# Patient Record
Sex: Male | Born: 1940 | Race: Black or African American | Hispanic: No | Marital: Married | Smoking: Former smoker
Health system: Southern US, Community
[De-identification: ages and names within clinical notes are randomized; demographics above are authoritative.]

## PROBLEM LIST (undated history)

## (undated) DIAGNOSIS — E785 Hyperlipidemia, unspecified: Secondary | ICD-10-CM

## (undated) DIAGNOSIS — M1990 Unspecified osteoarthritis, unspecified site: Secondary | ICD-10-CM

## (undated) DIAGNOSIS — E119 Type 2 diabetes mellitus without complications: Secondary | ICD-10-CM

## (undated) DIAGNOSIS — C801 Malignant (primary) neoplasm, unspecified: Secondary | ICD-10-CM

## (undated) DIAGNOSIS — E559 Vitamin D deficiency, unspecified: Secondary | ICD-10-CM

## (undated) HISTORY — PX: OTHER SURGICAL HISTORY: SHX169

---

## 2008-10-27 ENCOUNTER — Emergency Department (HOSPITAL_COMMUNITY): Admission: EM | Admit: 2008-10-27 | Discharge: 2008-10-27 | Payer: Self-pay | Admitting: Emergency Medicine

## 2009-12-04 ENCOUNTER — Emergency Department (HOSPITAL_COMMUNITY): Admission: EM | Admit: 2009-12-04 | Discharge: 2009-12-04 | Payer: Self-pay | Admitting: Emergency Medicine

## 2010-01-08 ENCOUNTER — Ambulatory Visit (HOSPITAL_COMMUNITY): Admission: RE | Admit: 2010-01-08 | Discharge: 2010-01-08 | Payer: Self-pay | Admitting: Orthopaedic Surgery

## 2010-01-15 ENCOUNTER — Ambulatory Visit (HOSPITAL_COMMUNITY): Admission: RE | Admit: 2010-01-15 | Discharge: 2010-01-15 | Payer: Self-pay | Admitting: Orthopaedic Surgery

## 2010-01-17 ENCOUNTER — Encounter: Payer: Self-pay | Admitting: Orthopaedic Surgery

## 2010-01-30 ENCOUNTER — Encounter: Payer: Self-pay | Admitting: Orthopaedic Surgery

## 2010-03-01 ENCOUNTER — Encounter: Payer: Self-pay | Admitting: Orthopaedic Surgery

## 2010-11-18 LAB — GLUCOSE, CAPILLARY: Glucose-Capillary: 109 mg/dL — ABNORMAL HIGH (ref 70–99)

## 2010-11-19 LAB — DIFFERENTIAL
Basophils Absolute: 0.1 10*3/uL (ref 0.0–0.1)
Eosinophils Absolute: 0.2 10*3/uL (ref 0.0–0.7)
Eosinophils Relative: 3 % (ref 0–5)
Lymphocytes Relative: 26 % (ref 12–46)
Lymphs Abs: 1.6 10*3/uL (ref 0.7–4.0)
Monocytes Relative: 6 % (ref 3–12)
Neutrophils Relative %: 64 % (ref 43–77)

## 2010-11-19 LAB — URINALYSIS, ROUTINE W REFLEX MICROSCOPIC
Nitrite: POSITIVE — AB
Protein, ur: NEGATIVE mg/dL
pH: 6 (ref 5.0–8.0)

## 2010-11-19 LAB — CBC
MCHC: 34.5 g/dL (ref 30.0–36.0)
Platelets: 223 10*3/uL (ref 150–400)
RBC: 3.97 MIL/uL — ABNORMAL LOW (ref 4.22–5.81)
RDW: 13.5 % (ref 11.5–15.5)
WBC: 6.3 10*3/uL (ref 4.0–10.5)

## 2010-11-19 LAB — URINE MICROSCOPIC-ADD ON

## 2017-12-07 ENCOUNTER — Ambulatory Visit (INDEPENDENT_AMBULATORY_CARE_PROVIDER_SITE_OTHER): Payer: Medicare Other | Admitting: Orthopedic Surgery

## 2017-12-07 ENCOUNTER — Ambulatory Visit (INDEPENDENT_AMBULATORY_CARE_PROVIDER_SITE_OTHER): Payer: Medicare Other

## 2017-12-07 VITALS — BP 109/53 | HR 71 | Ht 70.0 in | Wt 245.0 lb

## 2017-12-07 DIAGNOSIS — M1711 Unilateral primary osteoarthritis, right knee: Secondary | ICD-10-CM | POA: Diagnosis not present

## 2017-12-07 DIAGNOSIS — M25561 Pain in right knee: Secondary | ICD-10-CM

## 2017-12-07 NOTE — Progress Notes (Signed)
  NEW PATIENT OFFICE VISIT   Chief Complaint  Patient presents with  . Knee Pain    Right knee pain, no injury.    77 year old male presents for evaluation of his right knee  He complains of dull right knee pain medial aspect several months if not years associated with decreased range of motion   Review of Systems  Respiratory: Negative.   Cardiovascular: Negative for chest pain.  All other systems reviewed and are negative.    medical history diabetes hypertension history of cancer  No family history of serious medical disease  Had surgery on the left knee and a right index finger partial amputation   Social History   Tobacco Use  . Smoking status: Not on file  Substance Use Topics  . Alcohol use: Not on file  . Drug use: Not on file    No outpatient medications have been marked as taking for the 12/07/17 encounter (Office Visit) with Carole Civil, MD.    BP (!) 109/53   Pulse 71   Ht 5\' 10"  (1.778 m)   Wt 245 lb (111.1 kg)   BMI 35.15 kg/m   Physical Exam  Constitutional: He is oriented to person, place, and time. He appears well-developed and well-nourished.  Vital signs have been reviewed and are stable. Gen. appearance the patient is well-developed and well-nourished with normal grooming and hygiene.   Neurological: He is alert and oriented to person, place, and time.  Skin: Skin is warm and dry. No erythema.  Psychiatric: He has a normal mood and affect.  Vitals reviewed.   Ortho Exam Cane assisted ambulation  Right knee flexion arc 110 degrees slight flexion  contracture.  Knee stable anterior posterior medial lateral.  Muscle tone and strength normal no atrophy.  Skin normal no rash or erythema.  Distal peripheral edema mild sensation normal tenderness medial joint line without effusion  Left knee also symptomatic with medial joint line tenderness but no effusion range of motion arc similar MEDICAL DECISION SECTION  xrays ordered?  Yes  My  independent reading of xrays: See the report severe arthritis mild varus deformity right knee   Encounter Diagnoses  Name Primary?  . Right knee pain, unspecified chronicity Yes  . Primary osteoarthritis of right knee    The patient needs a total knee he says he does not want to do it right now so we gave him an injection advised him to come back when he symptomatic Procedure note right knee injection verbal consent was obtained to inject right knee joint  Timeout was completed to confirm the site of injection  The medications used were 40 mg of Depo-Medrol and 1% lidocaine 3 cc  Anesthesia was provided by ethyl chloride and the skin was prepped with alcohol.  After cleaning the skin with alcohol a 20-gauge needle was used to inject the right knee joint. There were no complications. A sterile bandage was applied.  PLAN:   I did give him some information about total knees for him to review he will be back because this will not take care of his problem he is aware.  He is agrees with the plan

## 2017-12-07 NOTE — Patient Instructions (Signed)
Total Knee Replacement Total knee replacement is a surgery to replace your knee joint with a man-made (prosthetic) joint. The man-made joint is called a prosthesis. It replaces parts of the thigh bone (femur), lower leg bone (tibia), and kneecap (patella). This surgery is done to lessen pain and improve knee movement. What happens before the procedure?  Ask your doctor about: ? Changing or stopping your normal medicines. This is especially important if you take diabetes medicines or blood thinners. ? Taking medicines such as aspirin and ibuprofen. These medicines can thin your blood. Do not take these medicines before your procedure if your doctor tells you not to.  Get all dental care that you need done before your procedure. Plan to not have dental work done for 3 months after your surgery.  Follow instructions from your doctor about what you cannot eat or drink.  Ask your doctor how your surgical site will be marked or identified.  You may be given antibiotic medicine to help prevent infection.  If your doctor prescribes physical therapy, do exercises as told.  Do not use any tobacco products, such as cigarettes, chewing tobacco, or e-cigarettes. If you need help quitting, ask your doctor.  You may have a physical exam.  You may have tests, such as: ? X-rays. ? MRI. ? CT scan. ? Bone scans.  You may have a blood or urine sample taken.  Plan to have someone take you home after the procedure.  If you will be going home right after the procedure, plan to have someone with you for at least 24 hours. It is best to have someone help care for you for at least 4-6 weeks after surgery. What happens during the procedure?  To reduce your risk of infection: ? Your health care team will wash or sanitize their hands. ? Your skin will be washed with soap.  An IV tube will be put into one of your veins.  You will be given one or more of the following: ? Sedative. This is a medicine that  makes you relaxed. ? Local anesthetic. This is a medicine to numb the area. ? General anesthetic. This is a medicine that makes you fall asleep. ? Spinal anesthetic. This is a medicine that numbs your body below the waist. ? Regional anesthetic. This is a medicine that numbs everything below the injection site.  A cut (incision) will be made in your knee.  Damaged parts of your thigh bone, lower leg bone, and kneecap will be removed.  A piece of metal (liner) will be placed on your thigh bone. Pieces of plastic will be placed on your lower leg bone and the underside of your kneecap.  One or more small tubes (drains) may be placed near your cut to help drain fluid.  Your cut will be closed with stitches (sutures), skin glue, or skin tape (adhesive) strips. Medicine may be put on your cut.  A bandage (dressing) will be placed over your cut. The procedure may vary among doctors and hospitals. What happens after the procedure?  Your blood pressure, heart rate, breathing rate, and blood oxygen level will be monitored often until the medicines you were given have worn off.  You may continue to get fluids and medicines through an IV tube.  You will have some pain. There will be medicines to help you.  You may have fluid coming from a drain.  You may have to wear special socks (compression stockings). These help to prevent blood clots and   reduce swelling in your legs.  You will be told to move around as much as possible.  You may be given a continuous passive motion machine to use at home. You will be shown how to use this machine.  Do not drive for 24 hours if you received a sedative. This information is not intended to replace advice given to you by your health care provider. Make sure you discuss any questions you have with your health care provider. Document Released: 11/10/2011 Document Revised: 04/21/2016 Document Reviewed: 07/25/2015 Elsevier Interactive Patient Education  2017  Elsevier Inc.  

## 2020-02-20 ENCOUNTER — Emergency Department (HOSPITAL_COMMUNITY)
Admission: EM | Admit: 2020-02-20 | Discharge: 2020-02-20 | Disposition: A | Discharge status: Home / Self Care | Payer: Medicare Other | Attending: Emergency Medicine | Admitting: Emergency Medicine

## 2020-02-20 ENCOUNTER — Emergency Department (HOSPITAL_COMMUNITY): Payer: Medicare Other

## 2020-02-20 ENCOUNTER — Other Ambulatory Visit: Payer: Self-pay

## 2020-02-20 ENCOUNTER — Encounter (HOSPITAL_COMMUNITY): Payer: Self-pay

## 2020-02-20 DIAGNOSIS — M25551 Pain in right hip: Secondary | ICD-10-CM | POA: Insufficient documentation

## 2020-02-20 DIAGNOSIS — M25552 Pain in left hip: Secondary | ICD-10-CM | POA: Diagnosis not present

## 2020-02-20 DIAGNOSIS — E119 Type 2 diabetes mellitus without complications: Secondary | ICD-10-CM | POA: Insufficient documentation

## 2020-02-20 DIAGNOSIS — Z87891 Personal history of nicotine dependence: Secondary | ICD-10-CM | POA: Insufficient documentation

## 2020-02-20 DIAGNOSIS — R2241 Localized swelling, mass and lump, right lower limb: Secondary | ICD-10-CM | POA: Diagnosis present

## 2020-02-20 HISTORY — DX: Type 2 diabetes mellitus without complications: E11.9

## 2020-02-20 HISTORY — DX: Unspecified osteoarthritis, unspecified site: M19.90

## 2020-02-20 HISTORY — DX: Vitamin D deficiency, unspecified: E55.9

## 2020-02-20 HISTORY — DX: Malignant (primary) neoplasm, unspecified: C80.1

## 2020-02-20 HISTORY — DX: Hyperlipidemia, unspecified: E78.5

## 2020-02-20 MED ORDER — DICLOFENAC SODIUM 1 % EX GEL: 2.0000 g | Freq: Four times a day (QID) | CUTANEOUS | 0 refills | Status: AC | PRN

## 2020-02-20 NOTE — ED Triage Notes (Signed)
Pt reports bilateral hip pain for a while which has gotten worse over the last 2 weeks. Pt has been taking tylenol. Pt feels like right leg swelling

## 2020-02-20 NOTE — ED Provider Notes (Signed)
Emergency Department Provider Note   I have reviewed the triage vital signs and the nursing notes.   HISTORY  Chief Complaint Hip Pain   HPI Gerald Crosby is a 79 y.o. male with past history of diabetes and hyperlipidemia presents to the emergency department with bilateral hip pain worsening over the past 2 weeks.  No known injury.  Patient denies significant lower back pain.  He has occasional radiation of discomfort down the left leg but no weakness or numbness.  He is able to ambulate with a cane.  He has seen Dr. Aline Brochure in the past with knee discomfort and they have discussed ultimately needing knee replacements.  He is not having fevers or joint swelling.  He does have a primary care doctor appointment set up this week to see a new PCP.  He denies any chest pain or shortness of breath symptoms.  He does mention some possible swelling in the right leg compared to the left which is also new over the past 2 weeks.   Past Medical History:  Diagnosis Date  . Arthritis   . Cancer [facility])    prostate cancer  . Diabetes mellitus without complication ([place])   . Hyperlipemia   . Vitamin D deficiency     There are no problems to display for this patient.   Past Surgical History:  Procedure Laterality Date  . right knee sx      Allergies Patient has no known allergies.  No family history on file.  Social History Social History   Tobacco Use  . Smoking status: Former Smoker  Substance Use Topics  . Alcohol use: Never  . Drug use: Never    Review of Systems  Constitutional: No fever/chills Eyes: No visual changes. ENT: No sore throat. Cardiovascular: Denies chest pain. Respiratory: Denies shortness of breath. Gastrointestinal: No abdominal pain.  No nausea, no vomiting.  No diarrhea.  No constipation. Genitourinary: Negative for dysuria. Musculoskeletal: Negative for back pain. Positive bilateral hip pain.  Skin: Negative for rash. Neurological: Negative for  headaches, focal weakness or numbness. No groin numbness or stool/urine incontinence or retention.   10-point ROS otherwise negative.  ____________________________________________   PHYSICAL EXAM:  VITAL SIGNS: ED Triage Vitals  Enc Vitals Group     BP 02/20/20 0808 129/73     Pulse Rate 02/20/20 0808 72     Resp --      Temp 02/20/20 0808 98 F (36.7 C)     Temp src --      SpO2 02/20/20 0808 97 %     Weight 02/20/20 0818 250 lb (113.4 kg)     Height 02/20/20 0818 5\' 9"  (1.753 m)   Constitutional: Alert and oriented. Well appearing and in no acute distress. Eyes: Conjunctivae are normal.  Head: Atraumatic. Nose: No congestion/rhinnorhea. Mouth/Throat: Mucous membranes are moist.  Neck: No stridor.  Cardiovascular: Normal rate, regular rhythm. Good peripheral circulation. Grossly normal heart sounds.   Respiratory: Normal respiratory effort.  No retractions. Lungs CTAB. Gastrointestinal: Soft and nontender. No distention.  Musculoskeletal: No gross deformities of extremities.  No erythema or swelling of the knees.  Normal passive range of motion of bilateral hips.  No tenderness over the ankles. Neurologic:  Normal speech and language.  Skin:  Skin is warm, dry and intact. No rash noted.  ____________________________________________  RADIOLOGY  Plain films reviewed.  ____________________________________________   PROCEDURES  Procedure(s) performed:   Procedures  None ____________________________________________   INITIAL IMPRESSION / ASSESSMENT AND PLAN /  ED COURSE  Pertinent labs & imaging results that were available during my care of the patient were reviewed by me and considered in my medical decision making (see chart for details).   Patient presents the emergency department with bilateral hip pain.  He continues to be ambulatory with a cane.  No concern for septic joint.  Plain films reviewed. OA noted without fracture or dislocation. Plan for PCP and  Ortho f/u. Topical pain med Rx sent to pharmacy.  ____________________________________________  FINAL CLINICAL IMPRESSION(S) / ED DIAGNOSES  Final diagnoses:  Bilateral hip pain    NEW OUTPATIENT MEDICATIONS STARTED DURING THIS VISIT:  Discharge Medication List as of 02/20/2020 11:07 AM    START taking these medications   Details  diclofenac Sodium (VOLTAREN) 1 % GEL Apply 2 g topically 4 (four) times daily as needed (pain)., Starting Mon 02/20/2020, Normal        Note:  This document was prepared using Dragon voice recognition software and may include unintentional dictation errors.  Nanda Quinton, MD, [facility] Emergency Medicine    Sebrena Engh, [facility], MD 02/21/20 1146

## 2020-02-20 NOTE — Discharge Instructions (Signed)
You were seen in the emergency department today with pain in both hips.  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  There are no broken bones or dislocations.  I have listed the name of your orthopedic specialist on the paperwork here.  I like for you to call their office today to schedule a follow-up appointment.  I have called in a prescription for some pain ointment to rub in the area of pain.  Please keep your follow-up appointment with your primary care doctor this week and return to the emergency department any new or suddenly worsening symptoms.

## 2020-02-28 ENCOUNTER — Other Ambulatory Visit: Payer: Self-pay

## 2020-02-28 ENCOUNTER — Ambulatory Visit (INDEPENDENT_AMBULATORY_CARE_PROVIDER_SITE_OTHER): Payer: Medicare Other | Admitting: Orthopaedic Surgery

## 2020-02-28 ENCOUNTER — Encounter: Payer: Self-pay | Admitting: Orthopaedic Surgery

## 2020-02-28 VITALS — BP 157/64 | HR 73 | Ht 69.0 in | Wt 251.0 lb

## 2020-02-28 DIAGNOSIS — M25551 Pain in right hip: Secondary | ICD-10-CM

## 2020-02-28 DIAGNOSIS — M25561 Pain in right knee: Secondary | ICD-10-CM | POA: Diagnosis not present

## 2020-02-28 DIAGNOSIS — M25552 Pain in left hip: Secondary | ICD-10-CM

## 2020-02-28 DIAGNOSIS — G8929 Other chronic pain: Secondary | ICD-10-CM

## 2020-02-28 MED ORDER — DICLOFENAC SODIUM 75 MG PO TBEC: 75.0000 mg | DELAYED_RELEASE_TABLET | Freq: Two times a day (BID) | ORAL | 2 refills | Status: DC

## 2020-02-28 NOTE — Progress Notes (Signed)
Subjective:    Patient ID: Gerald Crosby, male    DOB: November 19, 1940, 79 y.o.   MRN: [id number]  HPI He has long history of right knee pain. He saw Dr. Aline Brochure for this in 2018 and had injection.  He has good and bad days but has had more swelling recently.  He was told about a total knee in the past.  He went to the ER 02-20-2020 complaining of hip pain and some knee pain.  X-rays were done of the hips.    I have independently reviewed and interpreted x-rays of this patient done at another site by another physician or qualified health professional.  He says his right knee is hurting more today.  He wants fluid removed.  He is not taking any NSAIDs. He has no GI problem.  He has used Voltaren Gel which helps.  Tylenol does not help.   Review of Systems  Constitutional: Positive for activity change.  Musculoskeletal: Positive for arthralgias, gait problem and joint swelling.  All other systems reviewed and are negative.  For Review of Systems, all other systems reviewed and are negative.  The following is a summary of the past history medically, past history surgically, known current medicines, social history and family history.  This information is gathered electronically by the computer from prior information and documentation.  I review this each visit and have found including this information at this point in the chart is beneficial and informative.   Past Medical History:  Diagnosis Date  . Arthritis   . Cancer [facility])    prostate cancer  . Diabetes mellitus without complication ([place])   . Hyperlipemia   . Vitamin D deficiency     Past Surgical History:  Procedure Laterality Date  . right knee sx      Current Outpatient Medications on File Prior to Visit  Medication Sig Dispense Refill  . Cholecalciferol (VITAMIN D3) 1.25 MG (50000 UT) CAPS Take 1 capsule by mouth daily.    . diclofenac Sodium (VOLTAREN) 1 % GEL Apply 2 g topically 4 (four) times daily as needed (pain).  50 g 0  . doxazosin (CARDURA) 8 MG tablet Take 8 mg by mouth daily.    Marland Kitchen glipiZIDE (GLUCOTROL XL) 5 MG 24 hr tablet Take 5 mg by mouth daily with breakfast.    . lovastatin (MEVACOR) 40 MG tablet Take 80 mg by mouth at bedtime.     No current facility-administered medications on file prior to visit.    Social History   Socioeconomic History  . Marital status: Married    Spouse name: Not on file  . Number of children: Not on file  . Years of education: Not on file  . Highest education level: Not on file  Occupational History  . Not on file  Tobacco Use  . Smoking status: Former Research scientist (life sciences)  . Smokeless tobacco: Never Used  Vaping Use  . Vaping Use: Never used  Substance and Sexual Activity  . Alcohol use: Never  . Drug use: Never  . Sexual activity: Not on file  Other Topics Concern  . Not on file  Social History Narrative  . Not on file   Social Determinants of Health   Financial Resource Strain:   . Difficulty of Paying Living Expenses:   Food Insecurity:   . Worried About Charity fundraiser in the Last Year:   . [place] in the Last Year:   Transportation Needs:   . Lack of  Transportation (Medical):   Marland Kitchen Lack of Transportation (Non-Medical):   Physical Activity:   . Days of Exercise per Week:   . Minutes of Exercise per Session:   Stress:   . Feeling of Stress :   Social Connections:   . Frequency of Communication with Friends and Family:   . Frequency of Social Gatherings with Friends and Family:   . Attends Religious Services:   . Active Member of Clubs or Organizations:   . Attends Archivist Meetings:   Marland Kitchen Marital Status:   Intimate Partner Violence:   . Fear of Current or Ex-Partner:   . Emotionally Abused:   Marland Kitchen Physically Abused:   . Sexually Abused:     History reviewed. No pertinent family history.  BP (!) 157/64   Pulse 73   Ht 5\' 9"  (1.753 m)   Wt 251 lb (113.9 kg)   BMI 37.07 kg/m   Body mass index is 37.07 kg/m.       Objective:   Physical Exam Vitals and nursing note reviewed.  Constitutional:      Appearance: He is well-developed.  HENT:     Head: Normocephalic and atraumatic.  Eyes:     Conjunctiva/sclera: Conjunctivae normal.     Pupils: Pupils are equal, round, and reactive to light.  Cardiovascular:     Rate and Rhythm: Normal rate and regular rhythm.  Pulmonary:     Effort: Pulmonary effort is normal.  Abdominal:     Palpations: Abdomen is soft.  Musculoskeletal:     Cervical back: Normal range of motion and neck supple.       Legs:  Skin:    General: Skin is warm and dry.  Neurological:     Mental Status: He is alert and oriented to person, place, and time.     Cranial Nerves: No cranial nerve deficit.     Motor: No abnormal muscle tone.     Coordination: Coordination normal.     Deep Tendon Reflexes: Reflexes are normal and symmetric. Reflexes normal.  Psychiatric:        Behavior: Behavior normal.        Thought Content: Thought content normal.        Judgment: Judgment normal.           Assessment & Plan:   Encounter Diagnoses  Name Primary?  . Chronic pain of right knee Yes  . Bilateral hip pain    PROCEDURE NOTE:  The patient request injection, verbal consent was obtained.  The right knee was prepped appropriately after time out was performed.   Sterile technique was observed and anesthesia was provided by ethyl chloride and a 20-gauge needle was used to inject the knee area.  A 16-gauge needle was then used to aspirate the knee.  Color of fluid aspirated was bloody  Total cc's aspirated was 25.    Injection of 1 cc of Depo-Medrol 40 mg with several cc's of plain xylocaine was then performed.  A band aid dressing was applied.  The patient was advised to apply ice later today and tomorrow to the injection sight as needed.  I have given Rx for Voltaren 75 po.  Return in three weeks.  Call if any problem.  Precautions discussed.   Electronically  Signed Sanjuana Kava, MD 6/29/20212:19 PM

## 2020-03-20 ENCOUNTER — Encounter: Payer: Self-pay | Admitting: Orthopaedic Surgery

## 2020-03-20 ENCOUNTER — Other Ambulatory Visit: Payer: Self-pay

## 2020-03-20 ENCOUNTER — Ambulatory Visit (INDEPENDENT_AMBULATORY_CARE_PROVIDER_SITE_OTHER): Payer: Medicare Other | Admitting: Orthopaedic Surgery

## 2020-03-20 VITALS — BP 146/60 | HR 87 | Ht 69.0 in | Wt 250.0 lb

## 2020-03-20 DIAGNOSIS — M25561 Pain in right knee: Secondary | ICD-10-CM | POA: Diagnosis not present

## 2020-03-20 DIAGNOSIS — G8929 Other chronic pain: Secondary | ICD-10-CM | POA: Diagnosis not present

## 2020-03-20 NOTE — Progress Notes (Signed)
PROCEDURE NOTE:  The patient requests injections of the right knee , verbal consent was obtained.  The right knee was prepped appropriately after time out was performed.   Sterile technique was observed and injection of 1 cc of Depo-Medrol 40 mg with several cc's of plain xylocaine. Anesthesia was provided by ethyl chloride and a 20-gauge needle was used to inject the knee area. The injection was tolerated well.  A band aid dressing was applied.  The patient was advised to apply ice later today and tomorrow to the injection sight as needed.  Return in one month.  Electronically Signed Sanjuana Kava, MD 7/20/20212:48 PM

## 2020-04-17 ENCOUNTER — Other Ambulatory Visit: Payer: Self-pay

## 2020-04-17 ENCOUNTER — Encounter: Payer: Self-pay | Admitting: Orthopaedic Surgery

## 2020-04-17 ENCOUNTER — Ambulatory Visit (INDEPENDENT_AMBULATORY_CARE_PROVIDER_SITE_OTHER): Payer: Medicare Other | Admitting: Orthopaedic Surgery

## 2020-04-17 VITALS — Ht 69.0 in | Wt 258.0 lb

## 2020-04-17 DIAGNOSIS — M7061 Trochanteric bursitis, right hip: Secondary | ICD-10-CM | POA: Diagnosis not present

## 2020-04-17 NOTE — Progress Notes (Signed)
Patient Gerald Crosby, male DOB:17-Aug-1941, 79 y.o. MCN:[id number]  Chief Complaint  Patient presents with  . Knee Pain    right knee   . Hip Pain    Right side comes and goes,     HPI  Gerald Crosby is a 79 y.o. male who has developed pain of the lateral right hip.  He has no trauma.  He is using a cane.  He limps.  He has no redness, no numbness.  Nothing seems to help.   Body mass index is 38.1 kg/m.  ROS  Review of Systems  Constitutional: Positive for activity change.  Musculoskeletal: Positive for arthralgias, gait problem and joint swelling.  All other systems reviewed and are negative.   All other systems reviewed and are negative.  The following is a summary of the past history medically, past history surgically, known current medicines, social history and family history.  This information is gathered electronically by the computer from prior information and documentation.  I review this each visit and have found including this information at this point in the chart is beneficial and informative.    Past Medical History:  Diagnosis Date  . Arthritis   . Cancer [facility])    prostate cancer  . Diabetes mellitus without complication ([place])   . Hyperlipemia   . Vitamin D deficiency     Past Surgical History:  Procedure Laterality Date  . right knee sx      History reviewed. No pertinent family history.  Social History Social History   Tobacco Use  . Smoking status: Former Research scientist (life sciences)  . Smokeless tobacco: Never Used  Vaping Use  . Vaping Use: Never used  Substance Use Topics  . Alcohol use: Never  . Drug use: Never    No Known Allergies  Current Outpatient Medications  Medication Sig Dispense Refill  . Cholecalciferol (VITAMIN D3) 1.25 MG (50000 UT) CAPS Take 1 capsule by mouth daily.    . diclofenac (VOLTAREN) 75 MG EC tablet Take 1 tablet (75 mg total) by mouth 2 (two) times daily with a meal. 60 tablet 2  . diclofenac Sodium (VOLTAREN) 1 % GEL  Apply 2 g topically 4 (four) times daily as needed (pain). 50 g 0  . doxazosin (CARDURA) 8 MG tablet Take 8 mg by mouth daily.    Marland Kitchen glipiZIDE (GLUCOTROL XL) 5 MG 24 hr tablet Take 5 mg by mouth daily with breakfast.    . lovastatin (MEVACOR) 40 MG tablet Take 80 mg by mouth at bedtime.     No current facility-administered medications for this visit.     Physical Exam  Height 5\' 9"  (1.753 m), weight 258 lb (117 kg).  Constitutional: overall normal hygiene, normal nutrition, well developed, normal grooming, normal body habitus. Assistive device:cane  Musculoskeletal: gait and station Limp right, muscle tone and strength are normal, no tremors or atrophy is present.  .  Neurological: coordination overall normal.  Deep tendon reflex/nerve stretch intact.  Sensation normal.  Cranial nerves II-XII intact.   Skin:   Normal overall no scars, lesions, ulcers or rashes. No psoriasis.  Psychiatric: Alert and oriented x 3.  Recent memory intact, remote memory unclear.  Normal mood and affect. Well groomed.  Good eye contact.  Cardiovascular: overall no swelling, no varicosities, no edema bilaterally, normal temperatures of the legs and arms, no clubbing, cyanosis and good capillary refill.  Lymphatic: palpation is normal.  Right hip is very tender over the lateral side over the trochanteric area.  ROM  is good.  Limp right.  All other systems reviewed and are negative   The patient has been educated about the nature of the problem(s) and counseled on treatment options.  The patient appeared to understand what I have discussed and is in agreement with it.  Encounter Diagnosis  Name Primary?  . Trochanteric bursitis, right hip Yes    PROCEDURE NOTE:  The patient request injection, verbal consent was obtained.  The right trochanteric area of the hip was prepped appropriately after time out was performed.   Sterile technique was observed and injection of 1 cc of Depo-Medrol 40 mg with  several cc's of plain xylocaine. Anesthesia was provided by ethyl chloride and a 20-gauge needle was used to inject the hip area. The injection was tolerated well.  A band aid dressing was applied.  The patient was advised to apply ice later today and tomorrow to the injection sight as needed.  PLAN Call if any problems.  Precautions discussed.  Continue current medications.   Return to clinic 1 month   Electronically Signed Sanjuana Kava, MD 8/17/202110:22 AM

## 2020-05-15 ENCOUNTER — Other Ambulatory Visit: Payer: Self-pay

## 2020-05-15 ENCOUNTER — Encounter: Payer: Self-pay | Admitting: Orthopaedic Surgery

## 2020-05-15 ENCOUNTER — Ambulatory Visit (INDEPENDENT_AMBULATORY_CARE_PROVIDER_SITE_OTHER): Payer: Medicare Other | Admitting: Orthopaedic Surgery

## 2020-05-15 VITALS — BP 135/57 | HR 70 | Ht 69.0 in | Wt 258.0 lb

## 2020-05-15 DIAGNOSIS — M7061 Trochanteric bursitis, right hip: Secondary | ICD-10-CM

## 2020-05-15 NOTE — Progress Notes (Signed)
PROCEDURE NOTE:  The patient request injection, verbal consent was obtained.  The right trochanteric area of the hip was prepped appropriately after time out was performed.   Sterile technique was observed and injection of 1 cc of Depo-Medrol 40 mg with several cc's of plain xylocaine. Anesthesia was provided by ethyl chloride and a 20-gauge needle was used to inject the hip area. The injection was tolerated well.  A band aid dressing was applied.  The patient was advised to apply ice later today and tomorrow to the injection sight as needed.  Return in one month.  Call if any problem.  Precautions discussed.   Electronically Signed Sanjuana Kava, MD 9/14/202110:37 AM

## 2020-06-12 ENCOUNTER — Other Ambulatory Visit: Payer: Self-pay

## 2020-06-12 ENCOUNTER — Encounter: Payer: Self-pay | Admitting: Orthopaedic Surgery

## 2020-06-12 ENCOUNTER — Ambulatory Visit (INDEPENDENT_AMBULATORY_CARE_PROVIDER_SITE_OTHER): Payer: Medicare Other | Admitting: Orthopaedic Surgery

## 2020-06-12 VITALS — BP 149/62 | HR 62 | Ht 69.0 in | Wt 253.0 lb

## 2020-06-12 DIAGNOSIS — M7061 Trochanteric bursitis, right hip: Secondary | ICD-10-CM | POA: Diagnosis not present

## 2020-06-12 MED ORDER — DICLOFENAC SODIUM 75 MG PO TBEC: 75.0000 mg | DELAYED_RELEASE_TABLET | Freq: Two times a day (BID) | ORAL | 5 refills | Status: DC

## 2020-06-12 NOTE — Progress Notes (Signed)
My hip is better  He has done well from the injection last time.  He has much less pain.  He uses a cane.  NV intact.  Encounter Diagnosis  Name Primary?  . Trochanteric bursitis, right hip Yes   I will see him as needed. Call if any problem.  Precautions discussed.    Electronically Signed Sanjuana Kava, MD 10/12/202110:11 AM

## 2020-10-22 DIAGNOSIS — R238 Other skin changes: Secondary | ICD-10-CM | POA: Diagnosis not present

## 2020-10-30 DIAGNOSIS — I5189 Other ill-defined heart diseases: Secondary | ICD-10-CM | POA: Diagnosis not present

## 2020-10-30 DIAGNOSIS — I517 Cardiomegaly: Secondary | ICD-10-CM | POA: Diagnosis not present

## 2020-10-30 DIAGNOSIS — I352 Nonrheumatic aortic (valve) stenosis with insufficiency: Secondary | ICD-10-CM | POA: Diagnosis not present

## 2020-11-07 DIAGNOSIS — E559 Vitamin D deficiency, unspecified: Secondary | ICD-10-CM | POA: Diagnosis not present

## 2020-11-07 DIAGNOSIS — M109 Gout, unspecified: Secondary | ICD-10-CM | POA: Diagnosis not present

## 2020-11-07 DIAGNOSIS — E119 Type 2 diabetes mellitus without complications: Secondary | ICD-10-CM | POA: Diagnosis not present

## 2020-11-07 DIAGNOSIS — I10 Essential (primary) hypertension: Secondary | ICD-10-CM | POA: Diagnosis not present

## 2020-11-07 DIAGNOSIS — E782 Mixed hyperlipidemia: Secondary | ICD-10-CM | POA: Diagnosis not present

## 2020-11-13 DIAGNOSIS — I10 Essential (primary) hypertension: Secondary | ICD-10-CM | POA: Diagnosis not present

## 2020-11-13 DIAGNOSIS — N189 Chronic kidney disease, unspecified: Secondary | ICD-10-CM | POA: Diagnosis not present

## 2020-11-13 DIAGNOSIS — E1165 Type 2 diabetes mellitus with hyperglycemia: Secondary | ICD-10-CM | POA: Diagnosis not present

## 2020-11-13 DIAGNOSIS — R011 Cardiac murmur, unspecified: Secondary | ICD-10-CM | POA: Diagnosis not present

## 2020-11-15 DIAGNOSIS — E782 Mixed hyperlipidemia: Secondary | ICD-10-CM | POA: Diagnosis not present

## 2020-11-15 DIAGNOSIS — E119 Type 2 diabetes mellitus without complications: Secondary | ICD-10-CM | POA: Diagnosis not present

## 2020-11-15 DIAGNOSIS — R011 Cardiac murmur, unspecified: Secondary | ICD-10-CM | POA: Diagnosis not present

## 2020-11-15 DIAGNOSIS — I10 Essential (primary) hypertension: Secondary | ICD-10-CM | POA: Diagnosis not present

## 2021-02-13 DIAGNOSIS — Z125 Encounter for screening for malignant neoplasm of prostate: Secondary | ICD-10-CM | POA: Diagnosis not present

## 2021-02-13 DIAGNOSIS — I10 Essential (primary) hypertension: Secondary | ICD-10-CM | POA: Diagnosis not present

## 2021-02-13 DIAGNOSIS — E559 Vitamin D deficiency, unspecified: Secondary | ICD-10-CM | POA: Diagnosis not present

## 2021-02-13 DIAGNOSIS — E782 Mixed hyperlipidemia: Secondary | ICD-10-CM | POA: Diagnosis not present

## 2021-02-13 DIAGNOSIS — E119 Type 2 diabetes mellitus without complications: Secondary | ICD-10-CM | POA: Diagnosis not present

## 2021-02-13 DIAGNOSIS — Z1159 Encounter for screening for other viral diseases: Secondary | ICD-10-CM | POA: Diagnosis not present

## 2021-02-21 DIAGNOSIS — I10 Essential (primary) hypertension: Secondary | ICD-10-CM | POA: Diagnosis not present

## 2021-02-21 DIAGNOSIS — E782 Mixed hyperlipidemia: Secondary | ICD-10-CM | POA: Diagnosis not present

## 2021-02-21 DIAGNOSIS — Z0000 Encounter for general adult medical examination without abnormal findings: Secondary | ICD-10-CM | POA: Diagnosis not present

## 2021-02-21 DIAGNOSIS — E1122 Type 2 diabetes mellitus with diabetic chronic kidney disease: Secondary | ICD-10-CM | POA: Diagnosis not present

## 2021-02-21 DIAGNOSIS — E559 Vitamin D deficiency, unspecified: Secondary | ICD-10-CM | POA: Diagnosis not present

## 2021-02-21 DIAGNOSIS — R011 Cardiac murmur, unspecified: Secondary | ICD-10-CM | POA: Diagnosis not present

## 2021-02-27 DIAGNOSIS — C61 Malignant neoplasm of prostate: Secondary | ICD-10-CM | POA: Diagnosis not present

## 2021-04-16 ENCOUNTER — Encounter: Payer: Self-pay | Admitting: Orthopaedic Surgery

## 2021-04-16 ENCOUNTER — Other Ambulatory Visit: Payer: Self-pay

## 2021-04-16 ENCOUNTER — Ambulatory Visit (INDEPENDENT_AMBULATORY_CARE_PROVIDER_SITE_OTHER): Payer: Medicare HMO | Admitting: Orthopaedic Surgery

## 2021-04-16 ENCOUNTER — Ambulatory Visit: Payer: Medicare HMO

## 2021-04-16 VITALS — BP 116/60 | HR 70 | Ht 69.0 in | Wt 255.6 lb

## 2021-04-16 DIAGNOSIS — M25561 Pain in right knee: Secondary | ICD-10-CM

## 2021-04-16 DIAGNOSIS — G8929 Other chronic pain: Secondary | ICD-10-CM | POA: Diagnosis not present

## 2021-04-16 NOTE — Progress Notes (Signed)
My right knee hurts.  He has had pain in the right knee for several months, getting worse.  He has swelling, giving way and pain.  He has no trauma.  He has been on cane for several weeks.  Nothing seems to help.  He has tried ice, heat, rubs and Tylenol.  He is limping.  He would like something done.  Right knee has swelling, large effusion, crepitus, and medial joint line pain.  He has positive medial McMurray.  NV intact.  Limp right.  Encounter Diagnosis  Name Primary?   Chronic pain of right knee Yes   X-rays were done of the right knee, reported separately.  PROCEDURE NOTE:  The patient request injection, verbal consent was obtained.  The right knee was prepped appropriately after time out was performed.   Sterile technique was observed and anesthesia was provided by ethyl chloride and a 20-gauge needle was used to inject the knee area.  A 16-gauge needle was then used to aspirate the knee.  Color of fluid aspirated was blood tinged  Total cc's aspirated was 45.    Injection of 1 cc of Celestone 6 mg with several cc's of plain xylocaine was then performed.  A band aid dressing was applied.  The patient was advised to apply ice later today and tomorrow to the injection sight as needed.   He has significant DJD of the knee.  I will see him in three weeks.  Call if any problem.  Precautions discussed.  Electronically Signed Sanjuana Kava, MD 8/16/20229:44 AM

## 2021-05-02 DIAGNOSIS — I352 Nonrheumatic aortic (valve) stenosis with insufficiency: Secondary | ICD-10-CM | POA: Diagnosis not present

## 2021-05-02 DIAGNOSIS — I361 Nonrheumatic tricuspid (valve) insufficiency: Secondary | ICD-10-CM | POA: Diagnosis not present

## 2021-05-02 DIAGNOSIS — I5189 Other ill-defined heart diseases: Secondary | ICD-10-CM | POA: Diagnosis not present

## 2021-05-02 DIAGNOSIS — I340 Nonrheumatic mitral (valve) insufficiency: Secondary | ICD-10-CM | POA: Diagnosis not present

## 2021-05-07 ENCOUNTER — Ambulatory Visit (INDEPENDENT_AMBULATORY_CARE_PROVIDER_SITE_OTHER): Payer: Medicare HMO | Admitting: Orthopaedic Surgery

## 2021-05-07 ENCOUNTER — Encounter: Payer: Self-pay | Admitting: Orthopaedic Surgery

## 2021-05-07 ENCOUNTER — Other Ambulatory Visit: Payer: Self-pay

## 2021-05-07 ENCOUNTER — Ambulatory Visit: Payer: Medicare HMO

## 2021-05-07 VITALS — BP 135/64 | HR 75 | Ht 69.0 in | Wt 250.0 lb

## 2021-05-07 DIAGNOSIS — G8929 Other chronic pain: Secondary | ICD-10-CM

## 2021-05-07 DIAGNOSIS — M25561 Pain in right knee: Secondary | ICD-10-CM

## 2021-05-07 DIAGNOSIS — M5441 Lumbago with sciatica, right side: Secondary | ICD-10-CM

## 2021-05-07 MED ORDER — HYDROCODONE-ACETAMINOPHEN 5-325 MG PO TABS: 1.0000 | ORAL_TABLET | ORAL | 0 refills | Status: AC | PRN

## 2021-05-07 NOTE — Progress Notes (Signed)
My back hurts now.  He has developed more pain in the right lower back with radiation to the right knee and right foot.  He is using a cane now.  He has taken tylenol, advil and used heat and ice with no help.  He is getting worse.  He has no trauma, no weakness.  Nothing seems to help.  He has pain with walking.  ROM of the back is limited secondary to pain.  He has tightness of the right lower back but no spasm.  NV intact.  Reflexes intact.  Limp to the right.  SLR is weakly positive on the right.  ROM of hips is good.  ROM of the right knee is 0 to 105 with crepitus.  Knee is stable.  X-rays were done of the lumbar spine, reported separately.  Encounter Diagnoses  Name Primary?   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  I am concerned about HNP.  I will call in pain medicine.  I have reviewed the [place] web site prior to prescribing narcotic medicine for this patient.  Return in three weeks.  Call if any problem.  Precautions discussed.  Electronically Signed Sanjuana Kava, MD 9/6/202211:10 AM

## 2021-05-15 ENCOUNTER — Other Ambulatory Visit: Payer: Self-pay

## 2021-05-15 ENCOUNTER — Ambulatory Visit (HOSPITAL_COMMUNITY)
Admission: RE | Admit: 2021-05-15 | Discharge: 2021-05-15 | Disposition: A | Discharge status: Home / Self Care | Payer: Medicare HMO | Source: Ambulatory Visit | Attending: Orthopaedic Surgery | Admitting: Orthopaedic Surgery

## 2021-05-15 DIAGNOSIS — G8929 Other chronic pain: Secondary | ICD-10-CM | POA: Diagnosis not present

## 2021-05-15 DIAGNOSIS — E782 Mixed hyperlipidemia: Secondary | ICD-10-CM | POA: Diagnosis not present

## 2021-05-15 DIAGNOSIS — M5441 Lumbago with sciatica, right side: Secondary | ICD-10-CM | POA: Diagnosis not present

## 2021-05-15 DIAGNOSIS — M48061 Spinal stenosis, lumbar region without neurogenic claudication: Secondary | ICD-10-CM | POA: Diagnosis not present

## 2021-05-15 DIAGNOSIS — R011 Cardiac murmur, unspecified: Secondary | ICD-10-CM | POA: Diagnosis not present

## 2021-05-15 DIAGNOSIS — I10 Essential (primary) hypertension: Secondary | ICD-10-CM | POA: Diagnosis not present

## 2021-05-15 DIAGNOSIS — M47816 Spondylosis without myelopathy or radiculopathy, lumbar region: Secondary | ICD-10-CM | POA: Diagnosis not present

## 2021-05-15 DIAGNOSIS — I350 Nonrheumatic aortic (valve) stenosis: Secondary | ICD-10-CM | POA: Diagnosis not present

## 2021-05-23 DIAGNOSIS — E782 Mixed hyperlipidemia: Secondary | ICD-10-CM | POA: Diagnosis not present

## 2021-05-23 DIAGNOSIS — E559 Vitamin D deficiency, unspecified: Secondary | ICD-10-CM | POA: Diagnosis not present

## 2021-05-23 DIAGNOSIS — I10 Essential (primary) hypertension: Secondary | ICD-10-CM | POA: Diagnosis not present

## 2021-05-23 DIAGNOSIS — E119 Type 2 diabetes mellitus without complications: Secondary | ICD-10-CM | POA: Diagnosis not present

## 2021-05-28 ENCOUNTER — Ambulatory Visit (INDEPENDENT_AMBULATORY_CARE_PROVIDER_SITE_OTHER): Payer: Medicare HMO | Admitting: Orthopaedic Surgery

## 2021-05-28 ENCOUNTER — Encounter: Payer: Self-pay | Admitting: Orthopaedic Surgery

## 2021-05-28 ENCOUNTER — Other Ambulatory Visit: Payer: Self-pay

## 2021-05-28 DIAGNOSIS — M5441 Lumbago with sciatica, right side: Secondary | ICD-10-CM | POA: Diagnosis not present

## 2021-05-28 DIAGNOSIS — M25561 Pain in right knee: Secondary | ICD-10-CM | POA: Diagnosis not present

## 2021-05-28 DIAGNOSIS — G8929 Other chronic pain: Secondary | ICD-10-CM | POA: Diagnosis not present

## 2021-05-28 MED ORDER — HYDROCODONE-ACETAMINOPHEN 5-325 MG PO TABS: ORAL_TABLET | ORAL | 0 refills | Status: DC

## 2021-05-28 NOTE — Progress Notes (Signed)
My back is tender.  He had the MRI of the lumbar spine.  It showed: IMPRESSION: 1. 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 2. Diffusely patent foramina.   I have explained the findings to him.  I have independently reviewed the MRI.    He has a cane and limps to the right.  He has right knee pain, effusion, crepitus, ROM 0 to 100.  Spine/Pelvis examination:  Inspection:  Overall, sacoiliac joint benign and hips nontender; without crepitus or defects.   Thoracic spine inspection: Alignment normal without kyphosis present   Lumbar spine inspection:  Alignment  with normal lumbar lordosis, without scoliosis apparent.   Thoracic spine palpation:  without tenderness of spinal processes   Lumbar spine palpation: without tenderness of lumbar area; without tightness of lumbar muscles    Range of Motion:   Lumbar flexion, forward flexion is normal without pain or tenderness    Lumbar extension is full without pain or tenderness   Left lateral bend is normal without pain or tenderness   Right lateral bend is normal without pain or tenderness   Straight leg raising is normal  Strength & tone: normal   Stability overall normal stability  Encounter Diagnoses  Name Primary?   Chronic right-sided low back pain with right-sided sciatica Yes   Chronic pain of right knee    I will renew his pain medicine.  I have reviewed the [place] web site prior to prescribing narcotic medicine for this patient.  Return in six weeks.  Call if any problem.  Precautions discussed.   Electronically Signed Sanjuana Kava, MD 9/27/202210:13 AM

## 2021-05-30 DIAGNOSIS — I10 Essential (primary) hypertension: Secondary | ICD-10-CM | POA: Diagnosis not present

## 2021-05-30 DIAGNOSIS — N183 Chronic kidney disease, stage 3 unspecified: Secondary | ICD-10-CM | POA: Diagnosis not present

## 2021-05-30 DIAGNOSIS — E785 Hyperlipidemia, unspecified: Secondary | ICD-10-CM | POA: Diagnosis not present

## 2021-05-30 DIAGNOSIS — Z23 Encounter for immunization: Secondary | ICD-10-CM | POA: Diagnosis not present

## 2021-05-30 DIAGNOSIS — E1122 Type 2 diabetes mellitus with diabetic chronic kidney disease: Secondary | ICD-10-CM | POA: Diagnosis not present

## 2021-07-09 ENCOUNTER — Other Ambulatory Visit: Payer: Self-pay

## 2021-07-09 ENCOUNTER — Encounter: Payer: Self-pay | Admitting: Orthopaedic Surgery

## 2021-07-09 ENCOUNTER — Ambulatory Visit (INDEPENDENT_AMBULATORY_CARE_PROVIDER_SITE_OTHER): Payer: Medicare HMO | Admitting: Orthopaedic Surgery

## 2021-07-09 VITALS — BP 140/59 | HR 74 | Ht 69.0 in | Wt 250.0 lb

## 2021-07-09 DIAGNOSIS — M5441 Lumbago with sciatica, right side: Secondary | ICD-10-CM

## 2021-07-09 DIAGNOSIS — G8929 Other chronic pain: Secondary | ICD-10-CM | POA: Diagnosis not present

## 2021-07-09 NOTE — Progress Notes (Signed)
I am not hurting as much  We have had unusually warm weather recently.  He is feeling better.  He has less lower back pain.  He has no new trauma.  He is using his cane.  Spine/Pelvis examination:  Inspection:  Overall, sacoiliac joint benign and hips nontender; without crepitus or defects.   Thoracic spine inspection: Alignment normal without kyphosis present   Lumbar spine inspection:  Alignment  with normal lumbar lordosis, without scoliosis apparent.   Thoracic spine palpation:  without tenderness of spinal processes   Lumbar spine palpation: without tenderness of lumbar area; without tightness of lumbar muscles    Range of Motion:   Lumbar flexion, forward flexion is normal without pain or tenderness    Lumbar extension is full without pain or tenderness   Left lateral bend is normal without pain or tenderness   Right lateral bend is normal without pain or tenderness   Straight leg raising is normal  Strength & tone: normal   Stability overall normal stability  Encounter Diagnosis  Name Primary?   Chronic right-sided low back pain with right-sided sciatica Yes   Return in two months.  Call if any problem.  Precautions discussed.  Electronically Signed Sanjuana Kava, MD 11/8/20229:49 AM

## 2021-07-12 DIAGNOSIS — E119 Type 2 diabetes mellitus without complications: Secondary | ICD-10-CM | POA: Diagnosis not present

## 2021-07-12 DIAGNOSIS — E785 Hyperlipidemia, unspecified: Secondary | ICD-10-CM | POA: Diagnosis not present

## 2021-07-12 DIAGNOSIS — Z87891 Personal history of nicotine dependence: Secondary | ICD-10-CM | POA: Diagnosis not present

## 2021-07-12 DIAGNOSIS — G8929 Other chronic pain: Secondary | ICD-10-CM | POA: Diagnosis not present

## 2021-07-12 DIAGNOSIS — Z8546 Personal history of malignant neoplasm of prostate: Secondary | ICD-10-CM | POA: Diagnosis not present

## 2021-07-12 DIAGNOSIS — I10 Essential (primary) hypertension: Secondary | ICD-10-CM | POA: Diagnosis not present

## 2021-07-12 DIAGNOSIS — Z7984 Long term (current) use of oral hypoglycemic drugs: Secondary | ICD-10-CM | POA: Diagnosis not present

## 2021-07-12 DIAGNOSIS — M1990 Unspecified osteoarthritis, unspecified site: Secondary | ICD-10-CM | POA: Diagnosis not present

## 2021-07-12 DIAGNOSIS — Z89021 Acquired absence of right finger(s): Secondary | ICD-10-CM | POA: Diagnosis not present

## 2021-07-12 DIAGNOSIS — Z6837 Body mass index (BMI) 37.0-37.9, adult: Secondary | ICD-10-CM | POA: Diagnosis not present

## 2021-07-12 DIAGNOSIS — Z8249 Family history of ischemic heart disease and other diseases of the circulatory system: Secondary | ICD-10-CM | POA: Diagnosis not present

## 2021-08-28 DIAGNOSIS — Z79899 Other long term (current) drug therapy: Secondary | ICD-10-CM | POA: Diagnosis not present

## 2021-08-28 DIAGNOSIS — I10 Essential (primary) hypertension: Secondary | ICD-10-CM | POA: Diagnosis not present

## 2021-08-28 DIAGNOSIS — E119 Type 2 diabetes mellitus without complications: Secondary | ICD-10-CM | POA: Diagnosis not present

## 2021-09-03 DIAGNOSIS — C61 Malignant neoplasm of prostate: Secondary | ICD-10-CM | POA: Diagnosis not present

## 2021-09-04 DIAGNOSIS — E782 Mixed hyperlipidemia: Secondary | ICD-10-CM | POA: Diagnosis not present

## 2021-09-04 DIAGNOSIS — I10 Essential (primary) hypertension: Secondary | ICD-10-CM | POA: Diagnosis not present

## 2021-09-04 DIAGNOSIS — N3281 Overactive bladder: Secondary | ICD-10-CM | POA: Diagnosis not present

## 2021-09-04 DIAGNOSIS — R011 Cardiac murmur, unspecified: Secondary | ICD-10-CM | POA: Diagnosis not present

## 2021-09-04 DIAGNOSIS — N183 Chronic kidney disease, stage 3 unspecified: Secondary | ICD-10-CM | POA: Diagnosis not present

## 2021-09-04 DIAGNOSIS — E119 Type 2 diabetes mellitus without complications: Secondary | ICD-10-CM | POA: Diagnosis not present

## 2021-09-10 ENCOUNTER — Ambulatory Visit: Payer: Medicare HMO | Admitting: Orthopaedic Surgery

## 2021-09-12 ENCOUNTER — Encounter: Payer: Self-pay | Admitting: Orthopaedic Surgery

## 2021-09-12 ENCOUNTER — Other Ambulatory Visit: Payer: Self-pay

## 2021-09-12 ENCOUNTER — Ambulatory Visit (INDEPENDENT_AMBULATORY_CARE_PROVIDER_SITE_OTHER): Payer: Medicare HMO | Admitting: Orthopaedic Surgery

## 2021-09-12 VITALS — BP 121/58 | HR 71 | Ht 69.0 in | Wt 245.0 lb

## 2021-09-12 DIAGNOSIS — G8929 Other chronic pain: Secondary | ICD-10-CM

## 2021-09-12 DIAGNOSIS — M5441 Lumbago with sciatica, right side: Secondary | ICD-10-CM

## 2021-09-12 MED ORDER — HYDROCODONE-ACETAMINOPHEN 5-325 MG PO TABS: ORAL_TABLET | ORAL | 0 refills | Status: DC

## 2021-09-12 NOTE — Progress Notes (Signed)
My back is more sore  He has had more back pain with the cold weather.  He has no trauma, no weakness, no numbness.  He has pain to the right hip and below the right knee at times.    Spine/Pelvis examination:  Inspection:  Overall, sacoiliac joint benign and hips nontender; without crepitus or defects.   Thoracic spine inspection: Alignment normal without kyphosis present   Lumbar spine inspection:  Alignment  with normal lumbar lordosis, without scoliosis apparent.   Thoracic spine palpation:  without tenderness of spinal processes   Lumbar spine palpation: without tenderness of lumbar area; without tightness of lumbar muscles    Range of Motion:   Lumbar flexion, forward flexion is normal without pain or tenderness    Lumbar extension is full without pain or tenderness   Left lateral bend is normal without pain or tenderness   Right lateral bend is normal without pain or tenderness   Straight leg raising is normal  Strength & tone: normal   Stability overall normal stability He uses a cane. Encounter Diagnosis  Name Primary?   Chronic right-sided low back pain with right-sided sciatica Yes   I will give pain medicine.  Continue the Tylenol as needed.  I have reviewed the [place] web site prior to prescribing narcotic medicine for this patient.  Return in two months.  Call if any problem.  Precautions discussed.  Electronically Signed Sanjuana Kava, MD 1/12/202310:28 AM

## 2021-11-06 DIAGNOSIS — I352 Nonrheumatic aortic (valve) stenosis with insufficiency: Secondary | ICD-10-CM | POA: Diagnosis not present

## 2021-11-06 DIAGNOSIS — I517 Cardiomegaly: Secondary | ICD-10-CM | POA: Diagnosis not present

## 2021-11-06 DIAGNOSIS — I340 Nonrheumatic mitral (valve) insufficiency: Secondary | ICD-10-CM | POA: Diagnosis not present

## 2021-11-06 DIAGNOSIS — I371 Nonrheumatic pulmonary valve insufficiency: Secondary | ICD-10-CM | POA: Diagnosis not present

## 2021-11-12 ENCOUNTER — Ambulatory Visit: Payer: Medicare HMO | Admitting: Orthopaedic Surgery

## 2021-11-12 ENCOUNTER — Other Ambulatory Visit: Payer: Self-pay

## 2021-11-12 ENCOUNTER — Encounter: Payer: Self-pay | Admitting: Orthopaedic Surgery

## 2021-11-12 VITALS — BP 132/67 | HR 74 | Ht 69.0 in | Wt 245.0 lb

## 2021-11-12 DIAGNOSIS — M25561 Pain in right knee: Secondary | ICD-10-CM | POA: Diagnosis not present

## 2021-11-12 DIAGNOSIS — G8929 Other chronic pain: Secondary | ICD-10-CM

## 2021-11-12 DIAGNOSIS — M5441 Lumbago with sciatica, right side: Secondary | ICD-10-CM

## 2021-11-12 NOTE — Progress Notes (Signed)
My back is about the same but my right knee is hurting. ? ?He has swelling of the right knee, popping but no giving way.  It has gotten worse over the last week. He has no trauma. ? ?Lower back has good and bad days.  He has no weakness, no new trauma.  He has more pain with cold weather. ? ?Spine/Pelvis examination: ? Inspection:  Overall, sacoiliac joint benign and hips nontender; without crepitus or defects. ? ? Thoracic spine inspection: Alignment normal without kyphosis present ? ? Lumbar spine inspection:  Alignment  with normal lumbar lordosis, without scoliosis apparent. ? ? Thoracic spine palpation:  without tenderness of spinal processes ? ? Lumbar spine palpation: without tenderness of lumbar area; without tightness of lumbar muscles  ? ? Range of Motion: ?  Lumbar flexion, forward flexion is normal without pain or tenderness  ?  Lumbar extension is full without pain or tenderness ?  Left lateral bend is normal without pain or tenderness ?  Right lateral bend is normal without pain or tenderness ?  Straight leg raising is normal ? Strength & tone: normal ? ? Stability overall normal stability ? ?Right knee has large effusion, crepitus, ROM 0 to 100 with limp to the right, stable.  NV intact. ? ?Encounter Diagnoses  ?Name Primary?  ? Chronic right-sided low back pain with right-sided sciatica Yes  ? Chronic pain of right knee   ? ?PROCEDURE NOTE: ? ?The patient request injection, verbal consent was obtained. ? ?The right knee was prepped appropriately after time out was performed.  ? ?Sterile technique was observed and anesthesia was provided by ethyl chloride and a 20-gauge needle was used to inject the knee area.  A 16-gauge needle was then used to aspirate the knee. ? ?Color of fluid aspirated was blood tinged ? ?Total cc's aspirated was 45.   ? ?Injection of 1 cc of DepoMedrol 40 ?mg with several cc's of plain xylocaine was then performed. ? ?A band aid dressing was applied. ? ?The patient was advised  to apply ice later today and tomorrow to the injection sight as needed.  ? ?Return in six weeks. ? ?Call if any problem. ? ?Precautions discussed. ? ?Electronically Signed ?Sanjuana Kava, MD ?3/14/202311:07 AM ? ?

## 2021-12-04 DIAGNOSIS — E559 Vitamin D deficiency, unspecified: Secondary | ICD-10-CM | POA: Diagnosis not present

## 2021-12-04 DIAGNOSIS — E1122 Type 2 diabetes mellitus with diabetic chronic kidney disease: Secondary | ICD-10-CM | POA: Diagnosis not present

## 2021-12-04 DIAGNOSIS — E782 Mixed hyperlipidemia: Secondary | ICD-10-CM | POA: Diagnosis not present

## 2021-12-04 DIAGNOSIS — N183 Chronic kidney disease, stage 3 unspecified: Secondary | ICD-10-CM | POA: Diagnosis not present

## 2021-12-04 DIAGNOSIS — I10 Essential (primary) hypertension: Secondary | ICD-10-CM | POA: Diagnosis not present

## 2021-12-11 DIAGNOSIS — N183 Chronic kidney disease, stage 3 unspecified: Secondary | ICD-10-CM | POA: Diagnosis not present

## 2021-12-11 DIAGNOSIS — R011 Cardiac murmur, unspecified: Secondary | ICD-10-CM | POA: Diagnosis not present

## 2021-12-11 DIAGNOSIS — I10 Essential (primary) hypertension: Secondary | ICD-10-CM | POA: Diagnosis not present

## 2021-12-11 DIAGNOSIS — E782 Mixed hyperlipidemia: Secondary | ICD-10-CM | POA: Diagnosis not present

## 2021-12-11 DIAGNOSIS — E1122 Type 2 diabetes mellitus with diabetic chronic kidney disease: Secondary | ICD-10-CM | POA: Diagnosis not present

## 2021-12-19 DIAGNOSIS — E119 Type 2 diabetes mellitus without complications: Secondary | ICD-10-CM | POA: Diagnosis not present

## 2021-12-19 DIAGNOSIS — H524 Presbyopia: Secondary | ICD-10-CM | POA: Diagnosis not present

## 2021-12-24 ENCOUNTER — Encounter: Payer: Self-pay | Admitting: Orthopaedic Surgery

## 2021-12-24 ENCOUNTER — Ambulatory Visit (INDEPENDENT_AMBULATORY_CARE_PROVIDER_SITE_OTHER): Payer: Medicare HMO | Admitting: Orthopaedic Surgery

## 2021-12-24 DIAGNOSIS — G8929 Other chronic pain: Secondary | ICD-10-CM

## 2021-12-24 DIAGNOSIS — M25561 Pain in right knee: Secondary | ICD-10-CM | POA: Diagnosis not present

## 2021-12-24 DIAGNOSIS — G5603 Carpal tunnel syndrome, bilateral upper limbs: Secondary | ICD-10-CM

## 2021-12-24 NOTE — Addendum Note (Signed)
Addended by: Brand Males E on: 12/24/2021 10:34 AM ? ? Modules accepted: Orders ? ?

## 2021-12-24 NOTE — Progress Notes (Signed)
My hands are numb and my knee is swelling. ? ?He has nocturnal numbness and pain in the hands that is getting worse.  He has median nerve distribution numbness.  He is dropping things. ? ?His right knee is swollen and painful again.  He has no trauma.  He uses a cane. ? ?Both hands have numbness in median nerve distribution.  He has positive Phalen, negative Tinel bilaterally.  Ulnar nerve and radial nerve function intact.  Pulses good. ? ?Right knee has effusion, pain, ROM 0 to 105, limp right, stable, no distal edema, NV intact. ? ?Encounter Diagnoses  ?Name Primary?  ? Carpal tunnel syndrome, bilateral Yes  ? Chronic pain of right knee   ? ?I will schedule for EMGs. ? ?PROCEDURE NOTE: ? ?The patient request injection, verbal consent was obtained. ? ?The right knee was prepped appropriately after time out was performed.  ? ?Sterile technique was observed and anesthesia was provided by ethyl chloride and a 20-gauge needle was used to inject the knee area.  A 16-gauge needle was then used to aspirate the knee. ? ?Color of fluid aspirated was blood tinged ? ?Total cc's aspirated was 30.   ? ?Injection of 1 cc of DepoMedrol 40 mg with several cc's of plain xylocaine was then performed. ? ?A band aid dressing was applied. ? ?The patient was advised to apply ice later today and tomorrow to the injection sight as needed.  ? ?Return in six weeks. ? ?Call if any problem. ? ?Precautions discussed. ? ?Electronically Signed ?Sanjuana Kava, MD ?4/25/202310:16 AM ? ?

## 2021-12-26 DIAGNOSIS — E785 Hyperlipidemia, unspecified: Secondary | ICD-10-CM | POA: Diagnosis not present

## 2021-12-26 DIAGNOSIS — I350 Nonrheumatic aortic (valve) stenosis: Secondary | ICD-10-CM | POA: Diagnosis not present

## 2021-12-26 DIAGNOSIS — I4510 Unspecified right bundle-branch block: Secondary | ICD-10-CM | POA: Diagnosis not present

## 2021-12-26 DIAGNOSIS — I10 Essential (primary) hypertension: Secondary | ICD-10-CM | POA: Diagnosis not present

## 2021-12-26 DIAGNOSIS — R001 Bradycardia, unspecified: Secondary | ICD-10-CM | POA: Diagnosis not present

## 2022-01-01 DIAGNOSIS — I352 Nonrheumatic aortic (valve) stenosis with insufficiency: Secondary | ICD-10-CM | POA: Diagnosis not present

## 2022-01-01 DIAGNOSIS — I5189 Other ill-defined heart diseases: Secondary | ICD-10-CM | POA: Diagnosis not present

## 2022-01-01 DIAGNOSIS — I517 Cardiomegaly: Secondary | ICD-10-CM | POA: Diagnosis not present

## 2022-01-16 IMAGING — DX DG HIP (WITH OR WITHOUT PELVIS) 2-3V*L*
3 series · 3 of 3 positions shown · non-contrast
Comparison: None.

CLINICAL DATA: Hip pain bilateral hip pain which has gotten worse
over 2 weeks.

EXAM:
DG HIP (WITH OR WITHOUT PELVIS) 2-3V RIGHT; DG HIP (WITH OR WITHOUT
PELVIS) 2-3V LEFT

[hip ap]
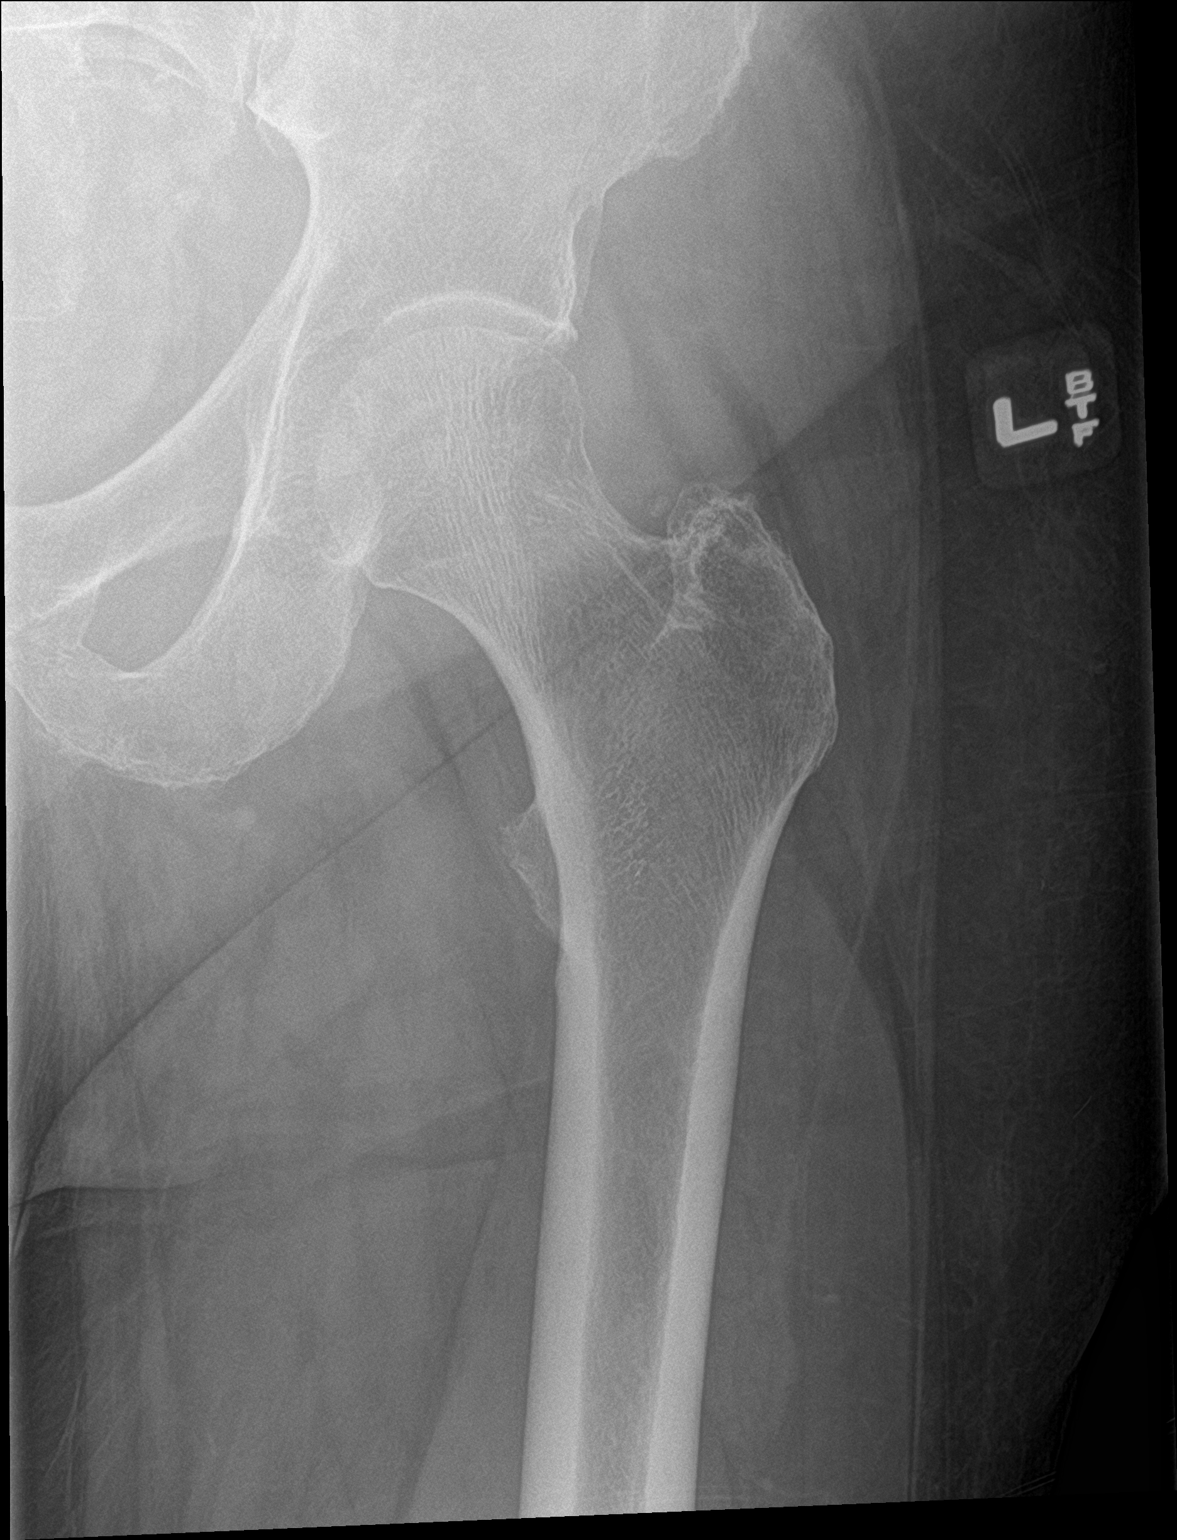

[hip lat]
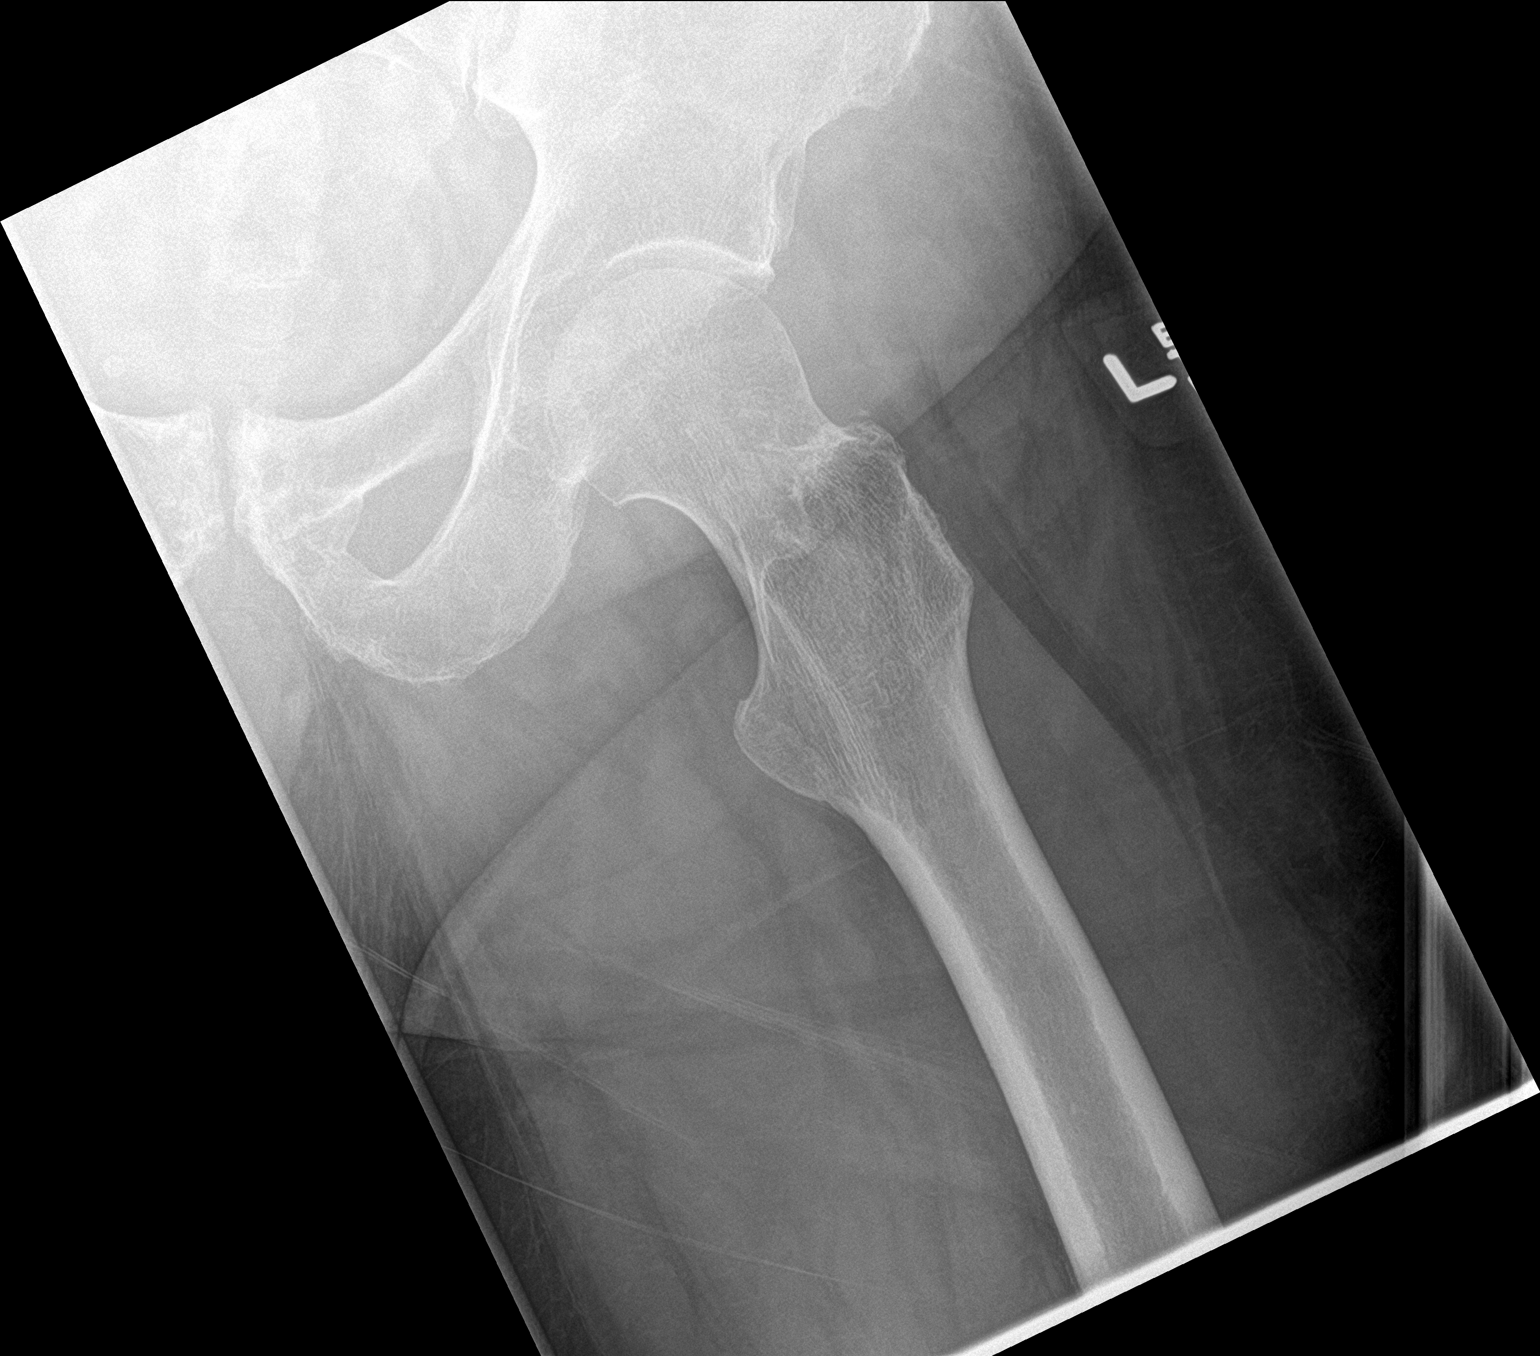

[pelvis ap]
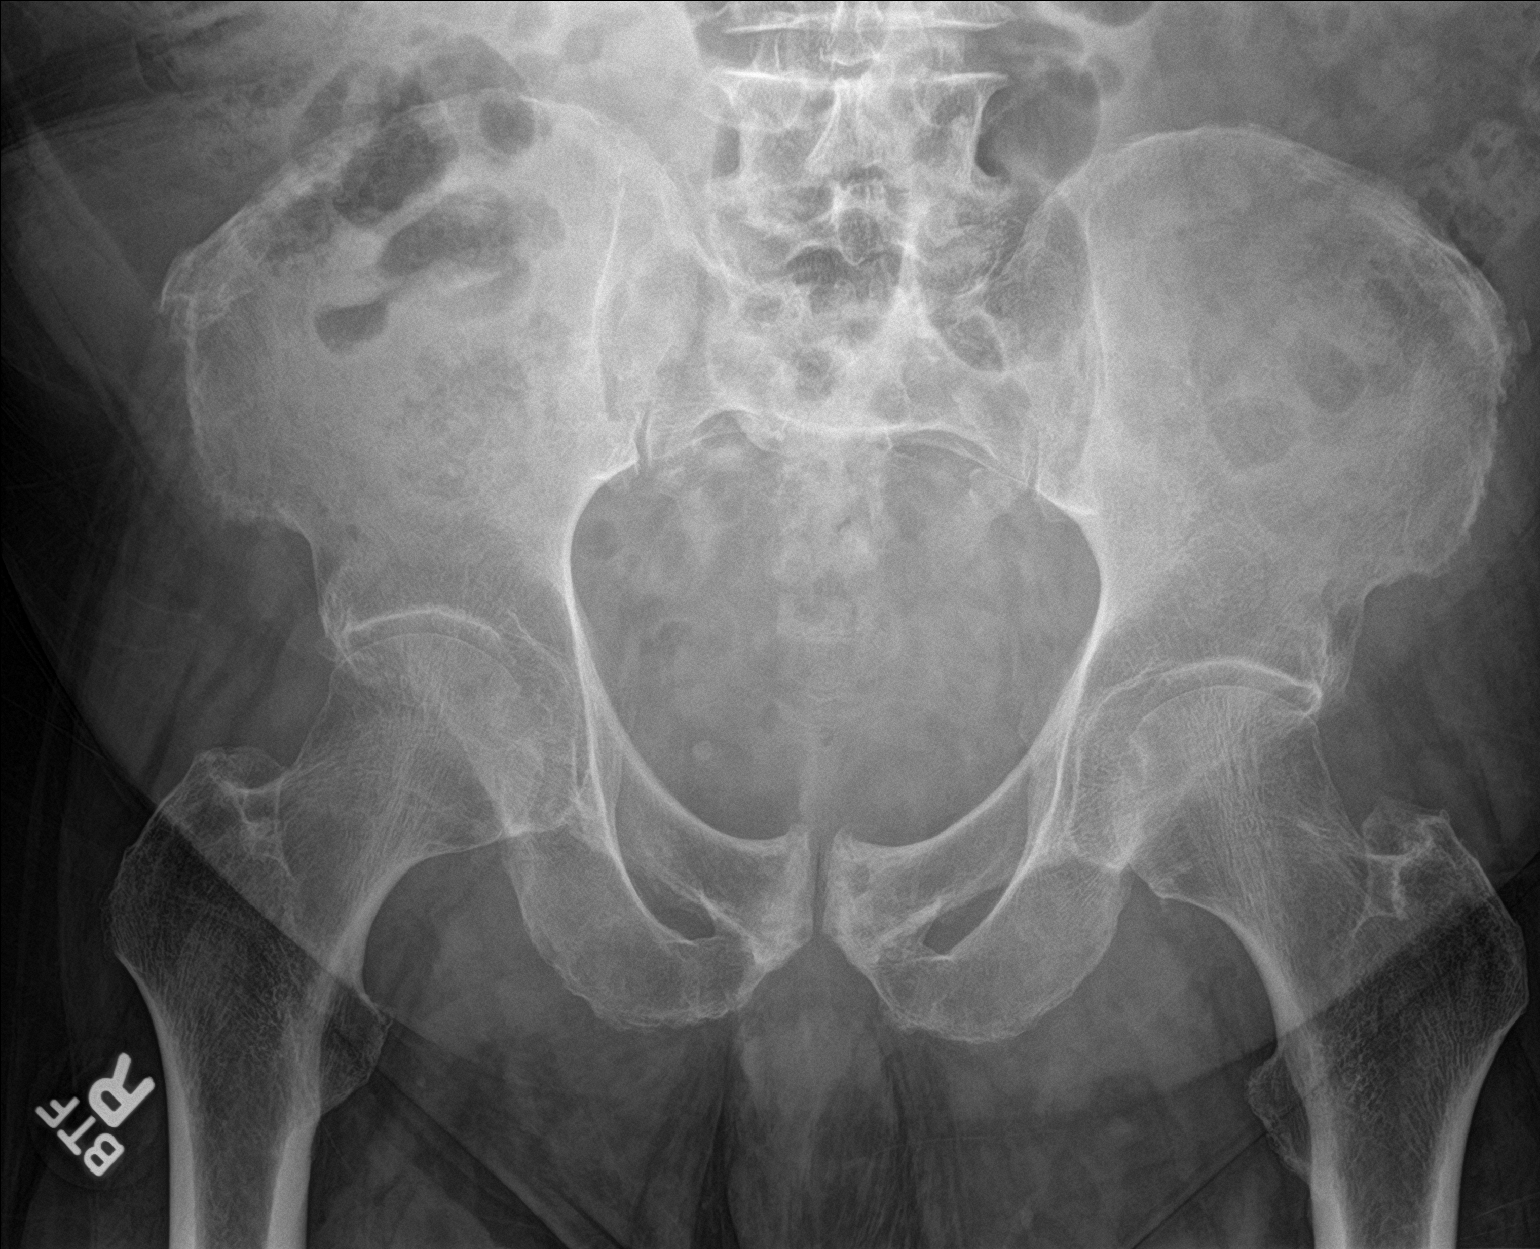

[3 of 3 positions shown; findings below may reference images not displayed]

FINDINGS: LEFT hip: LEFT hip is located without signs of fracture. Mild
degenerative changes.

RIGHT hip: RIGHT hip is located without signs of fracture or
dislocation. Mild degenerative changes about the RIGHT hip.

No sign of fracture about the bony pelvis. Enthesopathy along the
anterior superior iliac spine and lumbar spinal degenerative
changes.
IMPRESSION: Mild degenerative changes in the hips bilaterally. No acute osseous
abnormality.

## 2022-01-20 ENCOUNTER — Encounter: Payer: Self-pay | Admitting: Orthopedic Surgery

## 2022-01-20 ENCOUNTER — Ambulatory Visit (INDEPENDENT_AMBULATORY_CARE_PROVIDER_SITE_OTHER): Payer: Medicare HMO | Admitting: Orthopedic Surgery

## 2022-01-20 VITALS — BP 139/59 | HR 78 | Ht 69.0 in | Wt 250.0 lb

## 2022-01-20 DIAGNOSIS — M25561 Pain in right knee: Secondary | ICD-10-CM

## 2022-01-20 DIAGNOSIS — G8929 Other chronic pain: Secondary | ICD-10-CM | POA: Diagnosis not present

## 2022-01-20 NOTE — Patient Instructions (Signed)
Instructions Following Joint Aspiration  In clinic today, you received an aspiration in one of your joints (sometimes more than one).  Occasionally, you can have some pain at the injection site, this is normal.  You can place ice at the injection site, or take over-the-counter medications such as Tylenol (acetaminophen) or Advil (ibuprofen).  Please follow all directions listed on the bottle.  If your joint (knee or shoulder) becomes swollen, red or very painful, please contact the clinic for additional assistance.

## 2022-01-20 NOTE — Progress Notes (Signed)
Orthopaedic Clinic Return  Assessment: Gerald Crosby is a 81 y.o. male with the following: Chronic right knee pain; advanced arthritis   Plan: Gerald Crosby had a right knee aspiration, with a cortisone injection approximately 1 month ago.  He has had recurrence of the swelling.  He is interested in repeat aspiration.  This was completed in clinic today.  It is too early to proceed with another injection.  Follow-up in clinic as needed  Procedure note - aspiration right knee joint   Verbal consent was obtained to aspirate the right knee joint  Timeout was completed to confirm the site of aspiration. The skin was prepped with alcohol and ethyl chloride was sprayed at the aspiration site.  An 18-gauge needle was inserted and 80 cc of blood-tinged, clear joint fluid was aspirated using a superolateral approach.  There were no complications. A sterile bandage was applied.   Body mass index is 36.92 kg/m.  Follow-up: Return if symptoms worsen or fail to improve.   Subjective:  Chief Complaint  Patient presents with   Knee Pain    Right, Swelling for past 2 days    History of Present Illness: Gerald Crosby is a 81 y.o. male who returns to clinic for repeat evaluation of his right knee.  This is a patient of Dr. Brooke Bonito.  Approximately 1 month ago, he had his right knee aspirated, and received a cortisone injection.  This improved his symptoms until approximately 2 days ago.  No recent injury.  Over the past 2-3 days, he has noticed significant swelling in his right knee.  Review of Systems: No fevers or chills No numbness or tingling No chest pain No shortness of breath No bowel or bladder dysfunction No GI distress No headaches   Objective: BP (!) 139/59   Pulse 78   Ht '5\' 9"'$  (1.753 m)   Wt 250 lb (113.4 kg)   BMI 36.92 kg/m   Physical Exam:  Alert and oriented.  No acute distress.  Ambulates with the assistance of a cane.  Evaluation of right knee  demonstrates moderate effusion.  Tenderness palpation over the medial joint line.  Negative Lachman.  Increased laxity varus valgus stress.  There is no redness.  Range of motion from 5-100 degrees.  IMAGING: I personally ordered and reviewed the following images:  No new imaging obtained today.   Mordecai Rasmussen, MD 01/20/2022 5:13 PM

## 2022-01-29 ENCOUNTER — Encounter: Payer: Medicare HMO | Admitting: Physical Medicine and Rehabilitation

## 2022-02-11 ENCOUNTER — Encounter: Payer: Self-pay | Admitting: Orthopaedic Surgery

## 2022-02-11 ENCOUNTER — Ambulatory Visit (INDEPENDENT_AMBULATORY_CARE_PROVIDER_SITE_OTHER): Payer: Medicare HMO | Admitting: Orthopaedic Surgery

## 2022-02-11 VITALS — BP 122/59 | HR 64 | Ht 69.0 in | Wt 250.0 lb

## 2022-02-11 DIAGNOSIS — M25561 Pain in right knee: Secondary | ICD-10-CM

## 2022-02-11 DIAGNOSIS — G8929 Other chronic pain: Secondary | ICD-10-CM

## 2022-02-11 DIAGNOSIS — G5603 Carpal tunnel syndrome, bilateral upper limbs: Secondary | ICD-10-CM | POA: Diagnosis not present

## 2022-02-11 NOTE — Progress Notes (Signed)
My knee is better.  He saw Dr. Amedeo Kinsman in my absence.  His right knee is better.  He has effusion and crepitus but less pain. He is using a cane.  He decided not to do EMGs on hand and will "tolerate" the problem for now.  Right knee has effusion, crepitus, ROM 0 to 105 with limp to the right.  NV intact.  Right hand has positive Phalen and decreased sensation median nerve distribution.  Encounter Diagnoses  Name Primary?   Chronic pain of right knee Yes   Carpal tunnel syndrome, bilateral    Return in four weeks.  Samples of Aleve given, one twice a day.  Call if any problem.  Precautions discussed.  Electronically Signed Sanjuana Kava, MD 6/13/202310:13 AM

## 2022-03-06 DIAGNOSIS — C61 Malignant neoplasm of prostate: Secondary | ICD-10-CM | POA: Diagnosis not present

## 2022-03-12 ENCOUNTER — Ambulatory Visit: Payer: Medicare HMO | Admitting: Orthopaedic Surgery

## 2022-03-12 ENCOUNTER — Encounter: Payer: Self-pay | Admitting: Orthopaedic Surgery

## 2022-03-12 DIAGNOSIS — M25561 Pain in right knee: Secondary | ICD-10-CM | POA: Diagnosis not present

## 2022-03-12 DIAGNOSIS — Z0000 Encounter for general adult medical examination without abnormal findings: Secondary | ICD-10-CM | POA: Diagnosis not present

## 2022-03-12 DIAGNOSIS — G5603 Carpal tunnel syndrome, bilateral upper limbs: Secondary | ICD-10-CM

## 2022-03-12 DIAGNOSIS — G8929 Other chronic pain: Secondary | ICD-10-CM | POA: Diagnosis not present

## 2022-03-12 DIAGNOSIS — E1122 Type 2 diabetes mellitus with diabetic chronic kidney disease: Secondary | ICD-10-CM | POA: Diagnosis not present

## 2022-03-12 DIAGNOSIS — N183 Chronic kidney disease, stage 3 unspecified: Secondary | ICD-10-CM | POA: Diagnosis not present

## 2022-03-12 NOTE — Progress Notes (Signed)
My knee is better.  He took the Aleve and his right knee is better.  He has pain still and swelling and popping but no giving way.  He has a cane.  He has no new trauma.  He has bilateral carpal tunnel but does not want anything done for them now.  Right knee has effusion, crepitus, ROM 0 to 110, stable, limp right, NV intact, no distal edema.  Both hands have positive Phalen, negative Tinel.  Encounter Diagnoses  Name Primary?   Chronic pain of right knee Yes   Carpal tunnel syndrome, bilateral    Continue the Aleve.  Return in two months.  Call if any problem.  Precautions discussed.  Electronically Signed Sanjuana Kava, MD 7/12/202310:18 AM

## 2022-04-01 DIAGNOSIS — E782 Mixed hyperlipidemia: Secondary | ICD-10-CM | POA: Diagnosis not present

## 2022-04-01 DIAGNOSIS — R011 Cardiac murmur, unspecified: Secondary | ICD-10-CM | POA: Diagnosis not present

## 2022-04-01 DIAGNOSIS — N183 Chronic kidney disease, stage 3 unspecified: Secondary | ICD-10-CM | POA: Diagnosis not present

## 2022-04-01 DIAGNOSIS — E1122 Type 2 diabetes mellitus with diabetic chronic kidney disease: Secondary | ICD-10-CM | POA: Diagnosis not present

## 2022-04-01 DIAGNOSIS — I10 Essential (primary) hypertension: Secondary | ICD-10-CM | POA: Diagnosis not present

## 2022-05-13 ENCOUNTER — Encounter: Payer: Self-pay | Admitting: Orthopaedic Surgery

## 2022-05-13 ENCOUNTER — Ambulatory Visit: Payer: Medicare HMO | Admitting: Orthopaedic Surgery

## 2022-05-13 VITALS — BP 136/57 | HR 70 | Ht 69.0 in | Wt 250.0 lb

## 2022-05-13 DIAGNOSIS — M25561 Pain in right knee: Secondary | ICD-10-CM | POA: Diagnosis not present

## 2022-05-13 DIAGNOSIS — G5603 Carpal tunnel syndrome, bilateral upper limbs: Secondary | ICD-10-CM

## 2022-05-13 DIAGNOSIS — G8929 Other chronic pain: Secondary | ICD-10-CM | POA: Diagnosis not present

## 2022-05-13 NOTE — Progress Notes (Signed)
I am about the same.  He continues to have right knee pain at times.  He uses a cane.  He has no giving way.  He has swelling and popping.  He has no redness, no new trauma.  He has taken Aleve which helps.  He has bilateral carpal tunnel and does not want surgery.  ROM of the right knee is 0 to 100, crepitus, limp right, effusion, stable, NV intact.  Bilateral decreased sensation median nerve both hands, positive Phalens.  Encounter Diagnoses  Name Primary?   Chronic pain of right knee Yes   Carpal tunnel syndrome, bilateral    Continue the Aleve.  Return in two months.  Call if any problem.  Precautions discussed.  Electronically Signed Sanjuana Kava, MD 9/12/202310:38 AM

## 2022-05-13 NOTE — Patient Instructions (Signed)
Aspercreme, Biofreeze, Blue Emu or Voltaren Gel over the counter 2-3 times daily. Rub into area well each use for best results.   Follow up in 2 mths

## 2022-05-15 DIAGNOSIS — I350 Nonrheumatic aortic (valve) stenosis: Secondary | ICD-10-CM | POA: Diagnosis not present

## 2022-07-02 DIAGNOSIS — N183 Chronic kidney disease, stage 3 unspecified: Secondary | ICD-10-CM | POA: Diagnosis not present

## 2022-07-02 DIAGNOSIS — I10 Essential (primary) hypertension: Secondary | ICD-10-CM | POA: Diagnosis not present

## 2022-07-02 DIAGNOSIS — E1122 Type 2 diabetes mellitus with diabetic chronic kidney disease: Secondary | ICD-10-CM | POA: Diagnosis not present

## 2022-07-07 ENCOUNTER — Emergency Department (HOSPITAL_COMMUNITY): Payer: Medicare HMO

## 2022-07-07 ENCOUNTER — Encounter (HOSPITAL_COMMUNITY): Payer: Self-pay

## 2022-07-07 ENCOUNTER — Inpatient Hospital Stay (HOSPITAL_COMMUNITY)
Admission: EM | Admit: 2022-07-07 | Discharge: 2022-07-16 | DRG: 266 | Disposition: A | Discharge status: Home / Self Care | Payer: Medicare HMO | Attending: Cardiovascular Disease | Admitting: Cardiovascular Disease

## 2022-07-07 ENCOUNTER — Other Ambulatory Visit: Payer: Self-pay

## 2022-07-07 DIAGNOSIS — I5A Non-ischemic myocardial injury (non-traumatic): Secondary | ICD-10-CM

## 2022-07-07 DIAGNOSIS — R0902 Hypoxemia: Secondary | ICD-10-CM | POA: Diagnosis not present

## 2022-07-07 DIAGNOSIS — G8929 Other chronic pain: Secondary | ICD-10-CM | POA: Diagnosis present

## 2022-07-07 DIAGNOSIS — I491 Atrial premature depolarization: Secondary | ICD-10-CM | POA: Diagnosis not present

## 2022-07-07 DIAGNOSIS — Z1152 Encounter for screening for COVID-19: Secondary | ICD-10-CM

## 2022-07-07 DIAGNOSIS — E1122 Type 2 diabetes mellitus with diabetic chronic kidney disease: Secondary | ICD-10-CM | POA: Diagnosis present

## 2022-07-07 DIAGNOSIS — N1831 Chronic kidney disease, stage 3a: Secondary | ICD-10-CM | POA: Insufficient documentation

## 2022-07-07 DIAGNOSIS — Z6838 Body mass index (BMI) 38.0-38.9, adult: Secondary | ICD-10-CM

## 2022-07-07 DIAGNOSIS — I5033 Acute on chronic diastolic (congestive) heart failure: Secondary | ICD-10-CM | POA: Diagnosis present

## 2022-07-07 DIAGNOSIS — E669 Obesity, unspecified: Secondary | ICD-10-CM | POA: Diagnosis not present

## 2022-07-07 DIAGNOSIS — Z79899 Other long term (current) drug therapy: Secondary | ICD-10-CM

## 2022-07-07 DIAGNOSIS — I2510 Atherosclerotic heart disease of native coronary artery without angina pectoris: Secondary | ICD-10-CM | POA: Diagnosis present

## 2022-07-07 DIAGNOSIS — Z7984 Long term (current) use of oral hypoglycemic drugs: Secondary | ICD-10-CM

## 2022-07-07 DIAGNOSIS — N400 Enlarged prostate without lower urinary tract symptoms: Secondary | ICD-10-CM

## 2022-07-07 DIAGNOSIS — M1990 Unspecified osteoarthritis, unspecified site: Secondary | ICD-10-CM | POA: Diagnosis not present

## 2022-07-07 DIAGNOSIS — R7989 Other specified abnormal findings of blood chemistry: Secondary | ICD-10-CM

## 2022-07-07 DIAGNOSIS — I130 Hypertensive heart and chronic kidney disease with heart failure and stage 1 through stage 4 chronic kidney disease, or unspecified chronic kidney disease: Secondary | ICD-10-CM | POA: Diagnosis present

## 2022-07-07 DIAGNOSIS — E782 Mixed hyperlipidemia: Secondary | ICD-10-CM | POA: Insufficient documentation

## 2022-07-07 DIAGNOSIS — N1832 Chronic kidney disease, stage 3b: Secondary | ICD-10-CM | POA: Diagnosis present

## 2022-07-07 DIAGNOSIS — R55 Syncope and collapse: Secondary | ICD-10-CM | POA: Diagnosis not present

## 2022-07-07 DIAGNOSIS — Z87891 Personal history of nicotine dependence: Secondary | ICD-10-CM

## 2022-07-07 DIAGNOSIS — J9811 Atelectasis: Secondary | ICD-10-CM | POA: Diagnosis present

## 2022-07-07 DIAGNOSIS — Z8546 Personal history of malignant neoplasm of prostate: Secondary | ICD-10-CM

## 2022-07-07 DIAGNOSIS — K08109 Complete loss of teeth, unspecified cause, unspecified class: Secondary | ICD-10-CM | POA: Diagnosis present

## 2022-07-07 DIAGNOSIS — R739 Hyperglycemia, unspecified: Secondary | ICD-10-CM | POA: Diagnosis not present

## 2022-07-07 DIAGNOSIS — R42 Dizziness and giddiness: Secondary | ICD-10-CM | POA: Diagnosis not present

## 2022-07-07 DIAGNOSIS — Z923 Personal history of irradiation: Secondary | ICD-10-CM

## 2022-07-07 DIAGNOSIS — I440 Atrioventricular block, first degree: Secondary | ICD-10-CM | POA: Diagnosis present

## 2022-07-07 DIAGNOSIS — E1165 Type 2 diabetes mellitus with hyperglycemia: Secondary | ICD-10-CM | POA: Insufficient documentation

## 2022-07-07 DIAGNOSIS — E559 Vitamin D deficiency, unspecified: Secondary | ICD-10-CM | POA: Diagnosis present

## 2022-07-07 DIAGNOSIS — I350 Nonrheumatic aortic (valve) stenosis: Secondary | ICD-10-CM | POA: Diagnosis not present

## 2022-07-07 DIAGNOSIS — D696 Thrombocytopenia, unspecified: Secondary | ICD-10-CM | POA: Diagnosis present

## 2022-07-07 DIAGNOSIS — Z952 Presence of prosthetic heart valve: Secondary | ICD-10-CM

## 2022-07-07 DIAGNOSIS — Z006 Encounter for examination for normal comparison and control in clinical research program: Secondary | ICD-10-CM

## 2022-07-07 DIAGNOSIS — D649 Anemia, unspecified: Secondary | ICD-10-CM | POA: Diagnosis present

## 2022-07-07 LAB — CBC WITH DIFFERENTIAL/PLATELET
Abs Immature Granulocytes: 0.01 10*3/uL (ref 0.00–0.07)
Basophils Absolute: 0 10*3/uL (ref 0.0–0.1)
Basophils Relative: 0 %
Eosinophils Absolute: 0 10*3/uL (ref 0.0–0.5)
Eosinophils Relative: 1 %
HCT: 37.2 % — ABNORMAL LOW (ref 39.0–52.0)
Hemoglobin: 12.3 g/dL — ABNORMAL LOW (ref 13.0–17.0)
Immature Granulocytes: 0 %
Lymphocytes Relative: 15 %
Lymphs Abs: 1 10*3/uL (ref 0.7–4.0)
MCH: 33 pg (ref 26.0–34.0)
MCHC: 33.1 g/dL (ref 30.0–36.0)
MCV: 99.7 fL (ref 80.0–100.0)
Monocytes Absolute: 0.5 10*3/uL (ref 0.1–1.0)
Monocytes Relative: 8 %
Neutro Abs: 5 10*3/uL (ref 1.7–7.7)
Neutrophils Relative %: 76 %
Platelets: 129 10*3/uL — ABNORMAL LOW (ref 150–400)
RBC: 3.73 MIL/uL — ABNORMAL LOW (ref 4.22–5.81)
RDW: 12.2 % (ref 11.5–15.5)
WBC: 6.5 10*3/uL (ref 4.0–10.5)
nRBC: 0 % (ref 0.0–0.2)

## 2022-07-07 LAB — URINALYSIS, ROUTINE W REFLEX MICROSCOPIC
Bacteria, UA: NONE SEEN
Bilirubin Urine: NEGATIVE
Glucose, UA: >=500 mg/dL — AB
Hgb urine dipstick: NEGATIVE
Ketones, ur: NEGATIVE mg/dL
Leukocytes,Ua: NEGATIVE
Nitrite: NEGATIVE
Protein, ur: NEGATIVE mg/dL
Specific Gravity, Urine: 1.003 — ABNORMAL LOW (ref 1.005–1.030)
pH: 6 (ref 5.0–8.0)

## 2022-07-07 LAB — CBG MONITORING, ED: Glucose-Capillary: 232 mg/dL — ABNORMAL HIGH (ref 70–99)

## 2022-07-07 LAB — COMPREHENSIVE METABOLIC PANEL
ALT: 17 U/L (ref 0–44)
AST: 23 U/L (ref 15–41)
Albumin: 3.9 g/dL (ref 3.5–5.0)
Alkaline Phosphatase: 82 U/L (ref 38–126)
Anion gap: 7 (ref 5–15)
BUN: 17 mg/dL (ref 8–23)
CO2: 27 mmol/L (ref 22–32)
Calcium: 9.5 mg/dL (ref 8.9–10.3)
Chloride: 102 mmol/L (ref 98–111)
Creatinine, Ser: 1.52 mg/dL — ABNORMAL HIGH (ref 0.61–1.24)
GFR, Estimated: 45 mL/min — ABNORMAL LOW (ref >60)
Glucose, Bld: 247 mg/dL — ABNORMAL HIGH (ref 70–99)
Potassium: 4.2 mmol/L (ref 3.5–5.1)
Sodium: 136 mmol/L (ref 135–145)
Total Bilirubin: 0.8 mg/dL (ref 0.3–1.2)
Total Protein: 7.2 g/dL (ref 6.5–8.1)

## 2022-07-07 LAB — MAGNESIUM: Magnesium: 2 mg/dL (ref 1.7–2.4)

## 2022-07-07 LAB — TROPONIN I (HIGH SENSITIVITY)
Troponin I (High Sensitivity): 321 ng/L (ref <18)
Troponin I (High Sensitivity): 376 ng/L (ref <18)

## 2022-07-07 LAB — BRAIN NATRIURETIC PEPTIDE: B Natriuretic Peptide: 146 pg/mL — ABNORMAL HIGH (ref 0.0–100.0)

## 2022-07-07 MED ORDER — LACTATED RINGERS IV BOLUS
500.0000 mL | Freq: Once | INTRAVENOUS | Status: AC
  Administered 2022-07-07: 500 mL via INTRAVENOUS

## 2022-07-07 NOTE — ED Notes (Signed)
Lab in room.

## 2022-07-07 NOTE — ED Notes (Signed)
Critical value Troponin 376 Reported to EDP [place] at this time.

## 2022-07-07 NOTE — ED Notes (Signed)
Urinal at bedside.  

## 2022-07-07 NOTE — ED Triage Notes (Signed)
[facility] from home. Cc of LOC. Per ems they were called out earlier today for the same but patient refused transport. This evening patient went to go check the mail, came back, got dizzy, and passed out. Denies any injuries. Pt alert and oriented now. Ems states patient was slightly orthostatic+

## 2022-07-07 NOTE — ED Provider Notes (Signed)
[facility] EMERGENCY DEPARTMENT Provider Note   CSN: [id number] Arrival date & time: 07/07/22  1858     History {Add pertinent medical, surgical, social history, OB history to HPI:1} No chief complaint on file.   Gerald Crosby is a 81 y.o. male.  HPI Patient presents for suspected syncopal episode.  Medical history includes arthritis, DM, HLD, chronic pain.  EMS was called to his home earlier today following a syncopal event.  At that time, he was noted to have some orthostatic hypotension.  Patient refused transport at that time.  Later on this evening, patient's wife found him on the ground outside.  EMS noted normal vital signs during transit.  Blood sugar was elevated in the range of 280.  Patient states that he had 1 episode of syncope earlier this year.  He is currently followed by cardiologist and has every 6 month checkups.  Earlier today, he was mowing his leaves.  He felt some dizziness and was able to sit down on a bench.  He did have a loss of consciousness at this time.  He did not experience any associated chest discomfort.  When he came to, he felt weak.  Later tonight, he was getting his mail.  Again, he experienced a dizziness prodrome and was able to sit down on the ground.  He had a loss of consciousness again.  Currently, patient denies any symptoms.    Home Medications Prior to Admission medications   Medication Sig Start Date End Date Taking? Authorizing Provider  Cholecalciferol (VITAMIN D3) 1.25 MG (50000 UT) CAPS Take 1 capsule by mouth daily.    [provider]  diclofenac Sodium (VOLTAREN) 1 % GEL Apply 2 g topically 4 (four) times daily as needed (pain). 02/20/20   Long, Wonda Olds, MD  doxazosin (CARDURA) 8 MG tablet Take 8 mg by mouth daily.    [provider]  glipiZIDE (GLUCOTROL XL) 5 MG 24 hr tablet Take 5 mg by mouth daily with breakfast.    [provider]  HYDROcodone-acetaminophen (NORCO/VICODIN) 5-325 MG tablet One tablet  every six hours for pain.  Limit 7 days. 09/12/21   Sanjuana Kava, MD  lovastatin (MEVACOR) 40 MG tablet Take 80 mg by mouth at bedtime.    [provider]      Allergies    Patient has no known allergies.    Review of Systems   Review of Systems  Neurological:  Positive for dizziness and syncope.  All other systems reviewed and are negative.   Physical Exam Updated Vital Signs There were no vitals taken for this visit. Physical Exam Vitals and nursing note reviewed.  Constitutional:      General: He is not in acute distress.    Appearance: Normal appearance. He is well-developed. He is not ill-appearing, toxic-appearing or diaphoretic.  HENT:     Head: Normocephalic and atraumatic.     Right Ear: External ear normal.     Left Ear: External ear normal.     Nose: Nose normal.     Mouth/Throat:     Mouth: Mucous membranes are moist.     Pharynx: Oropharynx is clear.  Eyes:     Extraocular Movements: Extraocular movements intact.     Conjunctiva/sclera: Conjunctivae normal.  Cardiovascular:     Rate and Rhythm: Normal rate and regular rhythm.     Heart sounds: Murmur heard.  Pulmonary:     Effort: Pulmonary effort is normal. No respiratory distress.     Breath sounds:  Normal breath sounds. No wheezing or rales.  Chest:     Chest wall: No tenderness.  Abdominal:     General: There is no distension.     Palpations: Abdomen is soft.     Tenderness: There is no abdominal tenderness.  Musculoskeletal:        General: No swelling or deformity. Normal range of motion.     Cervical back: Normal range of motion and neck supple.     Right lower leg: Edema present.     Left lower leg: Edema present.  Skin:    General: Skin is warm and dry.     Capillary Refill: Capillary refill takes less than 2 seconds.     Coloration: Skin is not jaundiced or pale.  Neurological:     General: No focal deficit present.     Mental Status: He is alert and oriented to person, place,  and time.     Cranial Nerves: No cranial nerve deficit.     Sensory: No sensory deficit.     Motor: No weakness.     Coordination: Coordination normal.  Psychiatric:        Mood and Affect: Mood normal.        Behavior: Behavior normal.        Thought Content: Thought content normal.        Judgment: Judgment normal.     ED Results / Procedures / Treatments   Labs (all labs ordered are listed, but only abnormal results are displayed) Labs Reviewed - No data to display  EKG None  Radiology No results found.  Procedures Procedures  {Document cardiac monitor, telemetry assessment procedure when appropriate:1}  Medications Ordered in ED Medications - No data to display  ED Course/ Medical Decision Making/ A&P                           Medical Decision Making  This patient presents to the ED for concern of ***, this involves an extensive number of treatment options, and is a complaint that carries with it a high risk of complications and morbidity.  The differential diagnosis includes ***   Co morbidities that complicate the patient evaluation  ***   Additional history obtained:  Additional history obtained from *** External records from outside source obtained and reviewed including ***   Lab Tests:  I Ordered, and personally interpreted labs.  The pertinent results include:  ***   Imaging Studies ordered:  I ordered imaging studies including ***  I independently visualized and interpreted imaging which showed *** I agree with the radiologist interpretation   Cardiac Monitoring: / EKG:  The patient was maintained on a cardiac monitor.  I personally viewed and interpreted the cardiac monitored which showed an underlying rhythm of: ***   Consultations Obtained:  I requested consultation with the ***,  and discussed lab and imaging findings as well as pertinent plan - they recommend: ***   Problem List / ED Course / Critical interventions / Medication  management  *** I ordered medication including ***  for ***  Reevaluation of the patient after these medicines showed that the patient {resolved/improved/worsened:23923::"improved"} I have reviewed the patients home medicines and have made adjustments as needed   Social Determinants of Health:  ***   Test / Admission - Considered:  ***   {Document critical care time when appropriate:1} {Document review of labs and clinical decision tools ie heart score, Chads2Vasc2 etc:1}  {Document  your independent review of radiology images, and any outside records:1} {Document your discussion with family members, caretakers, and with consultants:1} {Document social determinants of health affecting pt's care:1} {Document your decision making why or why not admission, treatments were needed:1} Final Clinical Impression(s) / ED Diagnoses Final diagnoses:  None    Rx / DC Orders ED Discharge Orders     None

## 2022-07-07 NOTE — ED Notes (Signed)
Critical value- Troponin 321 reported to EDP Dixon at this time.  Orders- repeat in 2 hours.

## 2022-07-07 NOTE — H&P (Addendum)
History and Physical    Patient: Gerald Crosby UJW:[id number] DOB: August 27, 1941 DOA: 07/07/2022 DOS: the patient was seen and examined on 07/08/2022 PCP: Ludwig Clarks, FNP  Patient coming from: Home  Chief Complaint:  Chief Complaint  Patient presents with   Loss of Consciousness   HPI: Gerald Crosby is a 81 y.o. male with medical history significant of T2DM, BPH, vitamin D deficiency who presents to the Emergency Department via EMS due to syncopal episode.  Patient had a syncopal episode earlier today, he was mowing leaves in his yard when he felt dizzy, he went to sit down on a bench and shortly after this, he lost consciousness for few minutes and quickly regained consciousness without any postictal state.  EMS was called to his home, he was noted to have orthostatic hypotension and he refused to go to the ED at that time.  In the evening, while coming back from getting his mail from the mailbox, he felt dizzy again, so he sat down on the ground and he passed out again and just like earlier today, patient quickly regained consciousness.  He denies any chest discomfort, shortness of breath, fever, chills, biting of the tongue, urinary or bowel incontinence.  ED Course:  In the emergency department, patient was hemodynamically stable, BP was 128/49 and other vital signs were within normal range.  Work-up in the ED showed normocytic anemia and thrombocytopenia, BMP was normal except for blood glucose of 247 and creatinine of 1.52 (no recent labs for comparison).  Magnesium 2.0, BNP 146.0, troponin 321 > 376 > 439, urinalysis was positive for glycosuria. Chest x-ray showed low lung volumes  with linear opacities within the left lower lung favored to represent scarring or atelectasis. IV hydration of LR 500 mL was given.  Hospitalist was asked to admit patient for further evaluation and management.  Review of Systems: Review of systems as noted in the HPI. All other systems reviewed and are  negative.   Past Medical History:  Diagnosis Date   Arthritis    Cancer ([place])    prostate cancer   Diabetes mellitus without complication ([place])    Hyperlipemia    Vitamin D deficiency    Past Surgical History:  Procedure Laterality Date   right knee sx      Social History:  reports that he has quit smoking. He has never used smokeless tobacco. He reports that he does not drink alcohol and does not use drugs.   No Known Allergies  History reviewed. No pertinent family history.   Prior to Admission medications   Medication Sig Start Date End Date Taking? Authorizing Provider  Cholecalciferol (VITAMIN D3) 1.25 MG (50000 UT) CAPS Take 1 capsule by mouth daily.    [provider]  diclofenac Sodium (VOLTAREN) 1 % GEL Apply 2 g topically 4 (four) times daily as needed (pain). 02/20/20   Long, Wonda Olds, MD  doxazosin (CARDURA) 8 MG tablet Take 8 mg by mouth daily.    [provider]  glipiZIDE (GLUCOTROL XL) 5 MG 24 hr tablet Take 5 mg by mouth daily with breakfast.    [provider]  HYDROcodone-acetaminophen (NORCO/VICODIN) 5-325 MG tablet One tablet every six hours for pain.  Limit 7 days. 09/12/21   Sanjuana Kava, MD  lovastatin (MEVACOR) 40 MG tablet Take 80 mg by mouth at bedtime.    [provider]    Physical Exam: BP (!) 131/55 (BP Location: Right Arm)   Pulse 61   Temp 98.1  F (36.7 C)   Resp 17   Ht '5\' 9"'$  (1.753 m)   Wt 117.7 kg   SpO2 98%   BMI 38.32 kg/m   General: 81 y.o. year-old male well developed well nourished in no acute distress.  Alert and oriented x3. HEENT: NCAT, EOMI Neck: Supple, trachea medial Cardiovascular: Regular rate and rhythm with no rubs or gallops.  No thyromegaly or JVD noted.  +2 lower extremity edema. 2/4 pulses in all 4 extremities. Respiratory: Clear to auscultation with no wheezes or rales. Good inspiratory effort. Abdomen: Soft, nontender nondistended with normal bowel sounds x4  quadrants. Muskuloskeletal: No cyanosis, clubbing or edema noted bilaterally Neuro: CN II-XII intact, strength 5/5 x 4, sensation, reflexes intact Skin: No ulcerative lesions noted or rashes Psychiatry: Judgement and insight appear normal. Mood is appropriate for condition and setting          Labs on Admission:  Basic Metabolic Panel: Recent Labs  Lab 07/07/22 2000  NA 136  K 4.2  CL 102  CO2 27  GLUCOSE 247*  BUN 17  CREATININE 1.52*  CALCIUM 9.5  MG 2.0   Liver Function Tests: Recent Labs  Lab 07/07/22 2000  AST 23  ALT 17  ALKPHOS 82  BILITOT 0.8  PROT 7.2  ALBUMIN 3.9   No results for input(s): "LIPASE", "AMYLASE" in the last 168 hours. No results for input(s): "AMMONIA" in the last 168 hours. CBC: Recent Labs  Lab 07/07/22 2000  WBC 6.5  NEUTROABS 5.0  HGB 12.3*  HCT 37.2*  MCV 99.7  PLT 129*   Cardiac Enzymes: No results for input(s): "CKTOTAL", "CKMB", "CKMBINDEX", "TROPONINI" in the last 168 hours.  BNP (last 3 results) Recent Labs    07/07/22 2000  BNP 146.0*    ProBNP (last 3 results) No results for input(s): "PROBNP" in the last 8760 hours.  CBG: Recent Labs  Lab 07/07/22 1913  GLUCAP 232*    Radiological Exams on Admission: DG Chest Port 1 View  Result Date: 07/07/2022 CLINICAL DATA:  Syncope. EXAM: PORTABLE CHEST 1 VIEW COMPARISON:  None Available. FINDINGS: Normal cardiomediastinal contours. Lung volumes are low with slight asymmetric elevation of left hemidiaphragm. There are linear opacities identified within the left lower lung which are favored to represent areas of scarring or atelectasis. No signs of pleural effusion or interstitial edema. No airspace opacities. IMPRESSION: Low lung volumes with linear opacities within the left lower lung favored to represent scarring or atelectasis. Electronically Signed   By: Kerby Moors M.D.   On: 07/07/2022 20:23    EKG: I independently viewed the EKG done and my findings are as  followed: Normal sinus rhythm at a rate of 79 bpm  Assessment/Plan Present on Admission:  Syncope  NSTEMI (non-ST elevated myocardial infarction) ([place])  Principal Problem:   Syncope Active Problems:   NSTEMI (non-ST elevated myocardial infarction) (HCC)   Elevated troponin   Elevated brain natriuretic peptide (BNP) level   Uncontrolled type 2 diabetes mellitus with hyperglycemia, without long-term current use of insulin (HCC)   Mixed hyperlipidemia   BPH (benign prostatic hyperplasia)   Obesity (BMI 30-39.9)  Syncope Continue telemetry and watch for arrhythmias Troponins 321 > 376 > 439, continue to trend troponin EKG personally reviewed showed normal sinus rhythm at a rate of 79 bpm Echocardiogram will be done to rule out significant aortic stenosis or other outflow obstruction, and also to evaluate EF and to rule out segmental/Regional wall motion abnormalities.  Carotid artery Dopplers will be  done to rule out hemodynamically significant stenosis  Elevated troponin possibly due to NSTEMI r/o type II demand ischemia Troponin elevated as shown above Continue telemetry  Cardiology will be consulted to help decide if Stress test is needed in am Versus other  diagnostic modalities.     Elevated BNP r/o CHF BNP 146 Continue total input/output, daily weights and fluid restriction Continue Cardiac diet  Echocardiogram in the morning   Type 2 diabetes mellitus with hyperglycemia CBG 247 Continue Semglee 10 units nightly and adjust dose accordingly Continue ISS and hypoglycemia protocol  Thrombocytopenia possibly reactive Platelets are around 29, continue to monitor platelets to monitor labs  Mixed hyperlipidemia Continue Pravachol  BPH Continue doxazosin  Arthritis Continue Voltaren gel  Obesity (BMI 38.32) Diet and lifestyle modification   DVT prophylaxis: Heparin drip  Code Status: Full code  Consults: Cardiology  Family Communication: None at  bedside  Severity of Illness: The appropriate patient status for this patient is INPATIENT. Inpatient status is judged to be reasonable and necessary in order to provide the required intensity of service to ensure the patient's safety. The patient's presenting symptoms, physical exam findings, and initial radiographic and laboratory data in the context of their chronic comorbidities is felt to place them at high risk for further clinical deterioration. Furthermore, it is not anticipated that the patient will be medically stable for discharge from the hospital within 2 midnights of admission.   * I certify that at the point of admission it is my clinical judgment that the patient will require inpatient hospital care spanning beyond 2 midnights from the point of admission due to high intensity of service, high risk for further deterioration and high frequency of surveillance required.*  Author: Bernadette Hoit, DO 07/08/2022 1:54 AM  For on call review [email or web address].

## 2022-07-08 ENCOUNTER — Observation Stay (HOSPITAL_COMMUNITY): Payer: Medicare HMO

## 2022-07-08 ENCOUNTER — Encounter (HOSPITAL_COMMUNITY): Payer: Self-pay | Admitting: Internal Medicine

## 2022-07-08 ENCOUNTER — Observation Stay (HOSPITAL_BASED_OUTPATIENT_CLINIC_OR_DEPARTMENT_OTHER): Payer: Medicare HMO

## 2022-07-08 DIAGNOSIS — E785 Hyperlipidemia, unspecified: Secondary | ICD-10-CM

## 2022-07-08 DIAGNOSIS — I350 Nonrheumatic aortic (valve) stenosis: Secondary | ICD-10-CM | POA: Diagnosis not present

## 2022-07-08 DIAGNOSIS — E782 Mixed hyperlipidemia: Secondary | ICD-10-CM | POA: Insufficient documentation

## 2022-07-08 DIAGNOSIS — I5A Non-ischemic myocardial injury (non-traumatic): Secondary | ICD-10-CM

## 2022-07-08 DIAGNOSIS — R55 Syncope and collapse: Secondary | ICD-10-CM | POA: Diagnosis not present

## 2022-07-08 DIAGNOSIS — E1165 Type 2 diabetes mellitus with hyperglycemia: Secondary | ICD-10-CM | POA: Insufficient documentation

## 2022-07-08 DIAGNOSIS — E669 Obesity, unspecified: Secondary | ICD-10-CM | POA: Insufficient documentation

## 2022-07-08 DIAGNOSIS — N1831 Chronic kidney disease, stage 3a: Secondary | ICD-10-CM | POA: Insufficient documentation

## 2022-07-08 DIAGNOSIS — N400 Enlarged prostate without lower urinary tract symptoms: Secondary | ICD-10-CM | POA: Insufficient documentation

## 2022-07-08 LAB — ECHOCARDIOGRAM COMPLETE
AR max vel: 0.56 cm2
AV Area VTI: 0.48 cm2
AV Area mean vel: 0.54 cm2
AV Mean grad: 59.7 mmHg
AV Peak grad: 107.1 mmHg
Ao pk vel: 5.17 m/s
Area-P 1/2: 3.03 cm2
Height: 69 in
S' Lateral: 3.2 cm
Weight: 4151.7 oz

## 2022-07-08 LAB — CBC
HCT: 36.9 % — ABNORMAL LOW (ref 39.0–52.0)
Hemoglobin: 12.3 g/dL — ABNORMAL LOW (ref 13.0–17.0)
MCH: 33 pg (ref 26.0–34.0)
MCHC: 33.3 g/dL (ref 30.0–36.0)
MCV: 98.9 fL (ref 80.0–100.0)
Platelets: 136 10*3/uL — ABNORMAL LOW (ref 150–400)
RBC: 3.73 MIL/uL — ABNORMAL LOW (ref 4.22–5.81)
RDW: 12.2 % (ref 11.5–15.5)
WBC: 7 10*3/uL (ref 4.0–10.5)
nRBC: 0 % (ref 0.0–0.2)

## 2022-07-08 LAB — TROPONIN I (HIGH SENSITIVITY)
Troponin I (High Sensitivity): 439 ng/L (ref <18)
Troponin I (High Sensitivity): 461 ng/L (ref <18)
Troponin I (High Sensitivity): 467 ng/L (ref <18)
Troponin I (High Sensitivity): 495 ng/L (ref <18)

## 2022-07-08 LAB — GLUCOSE, CAPILLARY
Glucose-Capillary: 118 mg/dL — ABNORMAL HIGH (ref 70–99)
Glucose-Capillary: 174 mg/dL — ABNORMAL HIGH (ref 70–99)
Glucose-Capillary: 144 mg/dL — ABNORMAL HIGH (ref 70–99)
Glucose-Capillary: 171 mg/dL — ABNORMAL HIGH (ref 70–99)

## 2022-07-08 LAB — COMPREHENSIVE METABOLIC PANEL
ALT: 17 U/L (ref 0–44)
AST: 21 U/L (ref 15–41)
Albumin: 3.9 g/dL (ref 3.5–5.0)
Alkaline Phosphatase: 76 U/L (ref 38–126)
Anion gap: 4 — ABNORMAL LOW (ref 5–15)
BUN: 15 mg/dL (ref 8–23)
CO2: 27 mmol/L (ref 22–32)
Calcium: 9.4 mg/dL (ref 8.9–10.3)
Chloride: 105 mmol/L (ref 98–111)
Creatinine, Ser: 1.39 mg/dL — ABNORMAL HIGH (ref 0.61–1.24)
GFR, Estimated: 50 mL/min — ABNORMAL LOW (ref >60)
Glucose, Bld: 220 mg/dL — ABNORMAL HIGH (ref 70–99)
Potassium: 4.1 mmol/L (ref 3.5–5.1)
Sodium: 136 mmol/L (ref 135–145)
Total Bilirubin: 0.9 mg/dL (ref 0.3–1.2)
Total Protein: 7 g/dL (ref 6.5–8.1)

## 2022-07-08 LAB — HEPARIN LEVEL (UNFRACTIONATED)
Heparin Unfractionated: 0.47 IU/mL (ref 0.30–0.70)
Heparin Unfractionated: 0.55 IU/mL (ref 0.30–0.70)

## 2022-07-08 LAB — HEMOGLOBIN A1C
Hgb A1c MFr Bld: 6.4 % — ABNORMAL HIGH (ref 4.8–5.6)
Mean Plasma Glucose: 136.98 mg/dL

## 2022-07-08 LAB — PHOSPHORUS: Phosphorus: 2.6 mg/dL (ref 2.5–4.6)

## 2022-07-08 LAB — D-DIMER, QUANTITATIVE: D-Dimer, Quant: 0.52 ug/mL-FEU — ABNORMAL HIGH (ref 0.00–0.50)

## 2022-07-08 LAB — MAGNESIUM: Magnesium: 1.9 mg/dL (ref 1.7–2.4)

## 2022-07-08 MED ORDER — HEPARIN (PORCINE) 25000 UT/250ML-% IV SOLN
1350.0000 [IU]/h | INTRAVENOUS | Status: DC
  Administered 2022-07-08: 1350 [IU]/h via INTRAVENOUS
  Filled 2022-07-08: qty 250

## 2022-07-08 MED ORDER — ENOXAPARIN SODIUM 60 MG/0.6ML IJ SOSY: 60.0000 mg | PREFILLED_SYRINGE | INTRAMUSCULAR | Status: DC

## 2022-07-08 MED ORDER — INSULIN ASPART 100 UNIT/ML IJ SOLN
0.0000 [IU] | Freq: Three times a day (TID) | INTRAMUSCULAR | Status: DC
  Administered 2022-07-09: 2 [IU] via SUBCUTANEOUS
  Administered 2022-07-10 (×2): 3 [IU] via SUBCUTANEOUS
  Administered 2022-07-11: 2 [IU] via SUBCUTANEOUS
  Administered 2022-07-11: 3 [IU] via SUBCUTANEOUS
  Administered 2022-07-11 – 2022-07-12 (×2): 2 [IU] via SUBCUTANEOUS
  Administered 2022-07-12: 3 [IU] via SUBCUTANEOUS
  Administered 2022-07-12: 5 [IU] via SUBCUTANEOUS
  Administered 2022-07-13 (×2): 2 [IU] via SUBCUTANEOUS
  Administered 2022-07-13 – 2022-07-14 (×3): 3 [IU] via SUBCUTANEOUS
  Administered 2022-07-15 (×2): 2 [IU] via SUBCUTANEOUS
  Administered 2022-07-16: 8 [IU] via SUBCUTANEOUS
  Administered 2022-07-16: 3 [IU] via SUBCUTANEOUS

## 2022-07-08 MED ORDER — DOXAZOSIN MESYLATE 2 MG PO TABS
8.0000 mg | ORAL_TABLET | Freq: Every day | ORAL | Status: DC
  Administered 2022-07-08: 8 mg via ORAL
  Filled 2022-07-08: qty 4

## 2022-07-08 MED ORDER — ACETAMINOPHEN 650 MG RE SUPP: 650.0000 mg | Freq: Four times a day (QID) | RECTAL | Status: DC | PRN

## 2022-07-08 MED ORDER — ASPIRIN 81 MG PO TBEC
81.0000 mg | DELAYED_RELEASE_TABLET | Freq: Every day | ORAL | Status: DC
  Administered 2022-07-08: 81 mg via ORAL
  Filled 2022-07-08: qty 1

## 2022-07-08 MED ORDER — INSULIN GLARGINE-YFGN 100 UNIT/ML ~~LOC~~ SOLN
10.0000 [IU] | Freq: Every day | SUBCUTANEOUS | Status: DC
  Administered 2022-07-08 – 2022-07-13 (×5): 10 [IU] via SUBCUTANEOUS
  Administered 2022-07-14: 5 [IU] via SUBCUTANEOUS
  Administered 2022-07-15: 10 [IU] via SUBCUTANEOUS
  Filled 2022-07-08 (×11): qty 0.1

## 2022-07-08 MED ORDER — PRAVASTATIN SODIUM 40 MG PO TABS
40.0000 mg | ORAL_TABLET | Freq: Every day | ORAL | Status: DC
  Administered 2022-07-08 – 2022-07-15 (×8): 40 mg via ORAL
  Filled 2022-07-08 (×8): qty 1

## 2022-07-08 MED ORDER — ONDANSETRON HCL 4 MG/2ML IJ SOLN: 4.0000 mg | Freq: Four times a day (QID) | INTRAMUSCULAR | Status: DC | PRN

## 2022-07-08 MED ORDER — INSULIN ASPART 100 UNIT/ML IJ SOLN: 0.0000 [IU] | Freq: Every day | INTRAMUSCULAR | Status: DC

## 2022-07-08 MED ORDER — DICLOFENAC SODIUM 1 % EX GEL: 2.0000 g | Freq: Four times a day (QID) | CUTANEOUS | Status: DC | PRN

## 2022-07-08 MED ORDER — ONDANSETRON HCL 4 MG PO TABS: 4.0000 mg | ORAL_TABLET | Freq: Four times a day (QID) | ORAL | Status: DC | PRN

## 2022-07-08 MED ORDER — ACETAMINOPHEN 325 MG PO TABS: 650.0000 mg | ORAL_TABLET | Freq: Four times a day (QID) | ORAL | Status: DC | PRN

## 2022-07-08 MED ORDER — HEPARIN BOLUS VIA INFUSION
4000.0000 [IU] | Freq: Once | INTRAVENOUS | Status: AC
  Administered 2022-07-08: 4000 [IU] via INTRAVENOUS
  Filled 2022-07-08: qty 4000

## 2022-07-08 NOTE — Hospital Course (Signed)
Gerald Crosby is an 81 y.o. M with obesity, DM who presented with syncope.  Patient was mowing his yard earlier in the day when he felt dizzy then passed out.  Shortly after, he was walking back from getting the mail, and again, felt dizzy, sat down and passed out.  In the ER, electrolytes and Hgb level normal.  Dimer normal for age.  CXR with atelectasis only.  Troponin 400s and flat.    11/6: Admitted to telemetry 11/7: Cardiology consulted, echo ordered

## 2022-07-08 NOTE — Progress Notes (Signed)
ANTICOAGULATION CONSULT NOTE   Pharmacy Consult for Heparin Indication: chest pain/ACS  No Known Allergies  Patient Measurements: Height: '5\' 9"'$  (175.3 cm) Weight: 117.7 kg (259 lb 7.7 oz) IBW/kg (Calculated) : 70.7 Heparin Dosing Weight: 100 kg  Vital Signs: Temp: 98.1 F (36.7 C) (11/07 0158) Temp Source: Oral (11/07 0158) BP: 131/55 (11/07 0158) Pulse Rate: 61 (11/07 0158)  Labs: Recent Labs    07/07/22 2000 07/07/22 2204 07/08/22 0207 07/08/22 0343 07/08/22 0650 07/08/22 0805  HGB 12.3*  --  12.3*  --   --   --   HCT 37.2*  --  36.9*  --   --   --   PLT 129*  --  136*  --   --   --   HEPARINUNFRC  --   --   --   --   --  0.47  CREATININE 1.52*  --  1.39*  --   --   --   TROPONINIHS 321*   < > 467* 461* 495*  --    < > = values in this interval not displayed.     Estimated Creatinine Clearance: 51 mL/min (A) (by C-G formula based on SCr of 1.39 mg/dL (H)).   Medical History: Past Medical History:  Diagnosis Date   Arthritis    Cancer [facility])    prostate cancer   Diabetes mellitus without complication ([place])    Hyperlipemia    Vitamin D deficiency     Medications:  No current facility-administered medications on file prior to encounter.   Current Outpatient Medications on File Prior to Encounter  Medication Sig Dispense Refill   Cholecalciferol (VITAMIN D3) 1.25 MG (50000 UT) CAPS Take 1 capsule by mouth daily.     doxazosin (CARDURA) 8 MG tablet Take 8 mg by mouth daily.     glipiZIDE (GLUCOTROL XL) 5 MG 24 hr tablet Take 5 mg by mouth daily with breakfast.     lovastatin (MEVACOR) 40 MG tablet Take 80 mg by mouth at bedtime.     diclofenac Sodium (VOLTAREN) 1 % GEL Apply 2 g topically 4 (four) times daily as needed (pain). 50 g 0   [DISCONTINUED] HYDROcodone-acetaminophen (NORCO/VICODIN) 5-325 MG tablet One tablet every six hours for pain.  Limit 7 days. 28 tablet 0     Assessment: 81 y.o. male with syncope and elevated troponin, possible ACS, for  heparin  HL 0.47- therapeutic   Goal of Therapy:  Heparin level 0.3-0.7 units/ml Monitor platelets by anticoagulation protocol: Yes   Plan:  Continue heparin infusion at 1350 units/hr Check heparin level in 6 hours.   Margot Ables, PharmD Clinical Pharmacist 07/08/2022 8:46 AM

## 2022-07-08 NOTE — Assessment & Plan Note (Signed)
Cr at baseline 1.2-1.4

## 2022-07-08 NOTE — Progress Notes (Signed)
    Dr. Dellia Cloud tried calling the patient's wife earlier to update her on the plan of care but was unable to reach her. I did speak with his wife Burman Nieves) this afternoon and she is aware of plans to transfer to [name] for a heart catheterization tomorrow. She does not believe she will be able to come to [facility] but asked for the provider to call her with an update after his procedure.   Signed, Erma Heritage, PA-C 07/08/2022, 4:11 PM

## 2022-07-08 NOTE — Progress Notes (Signed)
Date and time results received: 07/08/22 0738 (use smartphrase ".now" to insert current time)  Test: Triponin  Critical Value: 495  Name of Provider Notified: Dr. Loleta Books  Orders Received? Or Actions Taken?: Awaiting new orders at this time, if any new orders to be given. Notified Via [employer] paging system

## 2022-07-08 NOTE — Assessment & Plan Note (Addendum)
Patient presented with 2 episodes of exertional syncope.    Cardiology consulted here and echo showed critical AS.  Cardiology recommend transfer for valvuloplasty or repair. - Stop heparin - Transfer to [name], appreciate Cardiology care - Gentle fluids - Stop alpha-blocker

## 2022-07-08 NOTE — ED Notes (Addendum)
Critical Value Troponin 439 Reported to Hospitalist Adefeso at 0100. See orders.

## 2022-07-08 NOTE — Assessment & Plan Note (Signed)
BMI 38 

## 2022-07-08 NOTE — Progress Notes (Addendum)
  Progress Note   Patient: Gerald Crosby JGO:[id number] DOB: 19-Feb-1941 DOA: 07/07/2022     0 DOS: the patient was seen and examined on 07/08/2022 at 10:09 AM      Brief hospital course: Gerald Crosby is an 81 y.o. M with obesity, DM who presented with syncope.  Patient was mowing his yard earlier in the day when he felt dizzy then passed out.  Shortly after, he was walking back from getting the mail, and again, felt dizzy, sat down and passed out.  In the ER, electrolytes and Hgb level normal.  Dimer normal for age.  CXR with atelectasis only.  Troponin 400s and flat.    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    Cardiology consulted here and echo showed critical AS.  Cardiology recommend transfer for valvuloplasty or repair.  - Transfer to [name], appreciate Cardiology care - Gentle fluids - Stop alpha-blocker  - Cardiology have asked to continue heparin gtt for now   Myocardial injury No chest pain or ECG changes.  Demand ischemia and NSTEMI ruled out. 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   Stage 3a chronic kidney disease (CKD) (HCC) Cr at baseline 1.2-1.4  Obesity (BMI 30-39.9) BMI 38  Mixed hyperlipidemia - Continue pravastatin  Controlled type 2 diabetes mellitus with hyperglycemia, without long-term current use of insulin (HCC) A1c 6.4% - Hold glipizide - Continue SS corrections          Subjective: Patient feels well, no chest pain, no dyspnea, no swelling, no confusion.     Physical Exam: BP (!) 124/55 (BP Location: Right Arm)   Pulse 62   Temp 98.6 F (37 C) (Oral)   Resp 19   Ht '5\' 9"'$  (1.753 m)   Wt 117.7 kg   SpO2 99%   BMI 38.32 kg/m   Obese adult male, lying in bed, no obvious distress, conversational RRR, very loud systolic  murmur, mild pitting peripheral edema, no JVD that I can appreciate Respiratory rate normal, lungs clear without rales or wheezes Abdomen soft without tenderness palpation or guarding Attention normal, affect appropriate, judgment and insight appear normal, oriented to person, place, time, situation, face symmetric, speech fluent, moves upper and lower extremities with 5/5 strength and normal coordination    Data Reviewed: Discussed with cardiology Echocardiogram shows severe aortic stenosis Creatinine slightly above baseline, stable Complete blood count normal, platelets lower than normal and hemoglobin lower limit of normal unremarkable Electrolytes normal     Family Communication: daughter by phone    Disposition: Status is: Observation The patient was admitted with exertional syncope, found to have critical aortic stenosis  This requires evaluation by cardiology in [facility] For valvuloplasty versus valve repair  Discussed with Dr. Desma Maxim, Cardiology will assume care as attending on arrival to [facility]        Author: Edwin Dada, MD 07/08/2022 2:54 PM  For on call review [email or web address].

## 2022-07-08 NOTE — Assessment & Plan Note (Signed)
Continue pravastatin 

## 2022-07-08 NOTE — Consult Note (Addendum)
Cardiology Consultation   Patient ID: Gerald Crosby MRN: [id number]; DOB: 04-Sep-1940  Admit date: 07/07/2022 Date of Consult: 07/08/2022  PCP:  Ludwig Clarks, [place] [facility] Cardiologist: Followed by Dr. Sabra Heck in [place], [place]  Patient Profile:   Gerald Crosby is a 81 y.o. male with a hx of HTN, HLD and Type 2 DM who is being seen 07/08/2022 for the evaluation of elevated troponin values at the request of Dr. Josephine Cables.  History of Present Illness:   Gerald Crosby presented to [facility] ED on 07/07/2022 for evaluation after having a syncopal episode. EMS had been called out to his home earlier in the day for syncope and he was found to have orthostatic hypotension but refused transportation at that time. Later that day, he was found on the ground by his wife and EMS was called back. Estimates LOC less than a minute but he is unsure. He reports he was in his normal state of health until the day of admission. Says he usually does yard work around the house without chest pain or progressive dyspnea on exertion. Reports prodromal dizziness yesterday but no chest pain or palpitations. No recent orthopnea or PND. Says he is followed by Dr. Sabra Heck in [place], [place] for a heart murmur. No known history of CAD or CHF.   Initial labs showed WBC 6.5, Hgb 12.3, platelets 129, Na+ 136, K+ 4.2 and creatinine 1.52 (no prior values available for comparison). Mg 2.0. BNP 146. Initial Hs Troponin 321 with repeat values of 376, 439, 467, 461 and 495. D-dimer 0.52. CXR showing low-lung volumes with linear opacities within the left lower lung favored to represent atelectasis or scarring. EKG shows NSR, HR 79 with baseline artifact but no acute ST abnormalities.   He was admitted for further work-up and started on IV Heparin. Echocardiogram and carotid dopplers are pending. No complaints at this current time.    Past Medical History:  Diagnosis Date   Arthritis    Cancer ([place])     prostate cancer   Diabetes mellitus without complication ([employer])    Hyperlipemia    Vitamin D deficiency     Past Surgical History:  Procedure Laterality Date   right knee sx       Home Medications:  Prior to Admission medications   Medication Sig Start Date End Date Taking? Authorizing Provider  Cholecalciferol (VITAMIN D3) 1.25 MG (50000 UT) CAPS Take 1 capsule by mouth daily.   Yes [provider]  doxazosin (CARDURA) 8 MG tablet Take 8 mg by mouth daily.   Yes [provider]  glipiZIDE (GLUCOTROL XL) 5 MG 24 hr tablet Take 5 mg by mouth daily with breakfast.   Yes [provider]  lovastatin (MEVACOR) 40 MG tablet Take 80 mg by mouth at bedtime.   Yes [provider]  diclofenac Sodium (VOLTAREN) 1 % GEL Apply 2 g topically 4 (four) times daily as needed (pain). 02/20/20   Long, Wonda Olds, MD    Inpatient Medications: Scheduled Meds:  doxazosin  8 mg Oral Daily   insulin aspart  0-15 Units Subcutaneous TID WC   insulin aspart  0-5 Units Subcutaneous QHS   insulin glargine-yfgn  10 Units Subcutaneous QHS   pravastatin  40 mg Oral q1800   Continuous Infusions:  heparin 1,350 Units/hr (07/08/22 0156)   PRN Meds: acetaminophen **OR** acetaminophen, diclofenac Sodium, ondansetron **OR** ondansetron (ZOFRAN) IV  Allergies:   No Known Allergies  Social History:  Social History   Socioeconomic History   Marital status: Married    Spouse name: Not on file   Number of children: Not on file   Years of education: Not on file   Highest education level: Not on file  Occupational History   Not on file  Tobacco Use   Smoking status: Former   Smokeless tobacco: Never  Vaping Use   Vaping Use: Never used  Substance and Sexual Activity   Alcohol use: Never   Drug use: Never   Sexual activity: Not on file  Other Topics Concern   Not on file  Social History Narrative   Not on file   Social Determinants of Health   Financial Resource  Strain: Not on file  Food Insecurity: No Food Insecurity (07/08/2022)   Hunger Vital Sign    Worried About Running Out of Food in the Last Year: Never true    Ran Out of Food in the Last Year: Never true  Transportation Needs: No Transportation Needs (07/08/2022)   PRAPARE - Hydrologist (Medical): No    Lack of Transportation (Non-Medical): No  Physical Activity: Not on file  Stress: Not on file  Social Connections: Not on file  Intimate Partner Violence: Not At Risk (07/08/2022)   Humiliation, Afraid, Rape, and Kick questionnaire    Fear of Current or Ex-Partner: No    Emotionally Abused: No    Physically Abused: No    Sexually Abused: No    Family History:     Family History  Problem Relation Age of Onset   Heart murmur Mother      ROS:  Please see the history of present illness.   All other ROS reviewed and negative.     Physical Exam/Data:   Vitals:   07/08/22 0100 07/08/22 0134 07/08/22 0158 07/08/22 0926  BP: (!) 130/52 (!) 131/55 (!) 131/55 (!) 124/55  Pulse: 62 61 61 62  Resp: '15 17 17 19  '$ Temp:  98.1 F (36.7 C) 98.1 F (36.7 C) 98.6 F (37 C)  TempSrc:   Oral Oral  SpO2: 98% 98%  99%  Weight:  117.7 kg 117.7 kg   Height:   '5\' 9"'$  (1.753 m)    No intake or output data in the 24 hours ending 07/08/22 0947    07/08/2022    1:58 AM 07/08/2022    1:34 AM 07/07/2022    7:08 PM  Last 3 Weights  Weight (lbs) 259 lb 7.7 oz 259 lb 7.7 oz 252 lb  Weight (kg) 117.7 kg 117.7 kg 114.306 kg     Body mass index is 38.32 kg/m.  General:  Well nourished, well developed male appearing in no acute distress HEENT: normal Neck: no JVD Vascular: No carotid bruits; Distal pulses 2+ bilaterally Cardiac:  normal S1, S2; Regular rhythm, bradycardiac rate. 2/6 SEM along RUSB.  Lungs:  clear to auscultation bilaterally, no wheezing, rhonchi or rales  Abd: soft, nontender, no hepatomegaly  Ext: no pitting edema. SCD's in place.  Musculoskeletal:   No deformities, BUE and BLE strength normal and equal Skin: warm and dry  Neuro:  CNs 2-12 intact, no focal abnormalities noted Psych:  Normal affect   EKG:  The EKG was personally reviewed and demonstrates: NSR, HR 79 with baseline artifact but no acute ST abnormalities.   Telemetry:  Telemetry was personally reviewed and demonstrates: NSR, HR in 50's to 60's with occasional PVC's.   Relevant CV Studies:  Echocardiogram:  2013 (From Care Everywhere) AORTIC VALVE     Leaflets: Tricuspid             Morphology: MILDLY THICKENED                                       Mobility: Fully Mobile   LEFT VENTRICLE                                      Anterior: Normal         Size: Normal                                 Lateral: Normal  Contraction: Normal                                  Septal: Normal   Closest EF: >55% (Estimated)                        Apical: Normal    LV masses: No Masses                             Inferior: Normal          LVH: None                                 Posterior: Normal        LVIDd:  4.9 cm  LVIDs:  3.4 cm  PWT: 1.2 cm  SWT: 1.1 cm  FS: 0.31   MITRAL VALVE     Leaflets: Normal Fully mobile   Morphology: Normal   LEFT ATRIUM         Size: Normal                     Diameter:  2.9 cm    LA masses: No masses               Normal IAS  RIGHT VENTRICLE         Size: Normal                    Free wall: Normal  Contraction: Normal                    RV masses: No Masses   RIGHT ATRIUM         Size: Normal                     RA Other: None    RA masses: No masses   PERICARDIUM       Fluid: No effusion   DOPPLER ECHO and OTHER SPECIAL PROCEDURES ------------------------------------    Aortic: No AR                  No AS    Mitral: No MR                  No MS  Tricuspid: TRIVIAL TR  No TS  Pulmonary: No PR                  No PS     Other:    Laboratory Data:  High Sensitivity Troponin:   Recent Labs  Lab 07/07/22 2204  07/08/22 0025 07/08/22 0207 07/08/22 0343 07/08/22 0650  TROPONINIHS 376* 439* 467* 461* 495*     Chemistry Recent Labs  Lab 07/07/22 2000 07/08/22 0207  NA 136 136  K 4.2 4.1  CL 102 105  CO2 27 27  GLUCOSE 247* 220*  BUN 17 15  CREATININE 1.52* 1.39*  CALCIUM 9.5 9.4  MG 2.0 1.9  GFRNONAA 45* 50*  ANIONGAP 7 4*    Recent Labs  Lab 07/07/22 2000 07/08/22 0207  PROT 7.2 7.0  ALBUMIN 3.9 3.9  AST 23 21  ALT 17 17  ALKPHOS 82 76  BILITOT 0.8 0.9   Lipids No results for input(s): "CHOL", "TRIG", "HDL", "LABVLDL", "LDLCALC", "CHOLHDL" in the last 168 hours.  Hematology Recent Labs  Lab 07/07/22 2000 07/08/22 0207  WBC 6.5 7.0  RBC 3.73* 3.73*  HGB 12.3* 12.3*  HCT 37.2* 36.9*  MCV 99.7 98.9  MCH 33.0 33.0  MCHC 33.1 33.3  RDW 12.2 12.2  PLT 129* 136*   Thyroid No results for input(s): "TSH", "FREET4" in the last 168 hours.  BNP Recent Labs  Lab 07/07/22 2000  BNP 146.0*    DDimer  Recent Labs  Lab 07/08/22 0650  DDIMER 0.52*     Radiology/Studies:  DG Chest Port 1 View  Result Date: 07/07/2022 CLINICAL DATA:  Syncope. EXAM: PORTABLE CHEST 1 VIEW COMPARISON:  None Available. FINDINGS: Normal cardiomediastinal contours. Lung volumes are low with slight asymmetric elevation of left hemidiaphragm. There are linear opacities identified within the left lower lung which are favored to represent areas of scarring or atelectasis. No signs of pleural effusion or interstitial edema. No airspace opacities. IMPRESSION: Low lung volumes with linear opacities within the left lower lung favored to represent scarring or atelectasis. Electronically Signed   By: Kerby Moors M.D.   On: 07/07/2022 20:23     Assessment and Plan:   1. Syncope/Elevated Troponin Values/Systolic Murmur - Reports being in his usual state of health until yesterday when he developed dizziness and had 2 episodes of syncope. There are reports of orthostatic hypotension by EMS report. He  denies any associated chest pain, palpitations or dyspnea on exertion. - Hs Troponin values have been found to be elevated, peaking at 495. EKG is without acute ST changes. - Unclear etiology of his syncope and troponin leak but given the sudden onset of symptoms, a cardiac arrhythmia is certainly a possible etiology. He has maintained normal sinus rhythm during admission with only occasional PVC's. Continue to follow on telemetry. Will recheck orthostatics. An echocardiogram is pending to assess for any structural abnormalities. If this is reassuring, would anticipate at minimum obtaining a Zio patch at the time of discharge. He would benefit from ischemic evaluation and pending echocardiogram results, this can help guide plans for inpatient versus outpatient testing. Continue IV Heparin for now. Would also start ASA '81mg'$  daily while continuing statin therapy. No beta-blocker due to baseline bradycardia.   2. HTN - His BP has been variable from 128/49  -145/55 since admission. He has been continued on Cardura '8mg'$  daily.   3. HLD - He was on Lovastatin '80mg'$  daily prior to admission. Hospital formulary ordered with Pravastatin '40mg'$  daily. Will recheck an FLP.  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 Unknown baseline as no prior values are available for comparison.   For questions or updates, please contact [place] Please consult [email or web address] for contact info under    Signed, Erma Heritage, PA-C  07/08/2022 9:47 AM

## 2022-07-08 NOTE — Progress Notes (Signed)
ANTICOAGULATION CONSULT NOTE   Pharmacy Consult for Heparin Indication: chest pain/ACS  No Known Allergies  Patient Measurements: Height: '5\' 9"'$  (175.3 cm) Weight: 117.7 kg (259 lb 7.7 oz) IBW/kg (Calculated) : 70.7 Heparin Dosing Weight: 100 kg  Vital Signs: Temp: 98.6 F (37 C) (11/07 0926) Temp Source: Oral (11/07 0926) BP: 124/55 (11/07 0926) Pulse Rate: 62 (11/07 0926)  Labs: Recent Labs    07/07/22 2000 07/07/22 2204 07/08/22 0207 07/08/22 0343 07/08/22 0650 07/08/22 0805 07/08/22 1236  HGB 12.3*  --  12.3*  --   --   --   --   HCT 37.2*  --  36.9*  --   --   --   --   PLT 129*  --  136*  --   --   --   --   HEPARINUNFRC  --   --   --   --   --  0.47 0.55  CREATININE 1.52*  --  1.39*  --   --   --   --   TROPONINIHS 321*   < > 467* 461* 495*  --   --    < > = values in this interval not displayed.     Estimated Creatinine Clearance: 51 mL/min (A) (by C-G formula based on SCr of 1.39 mg/dL (H)).   Medical History: Past Medical History:  Diagnosis Date   Arthritis    Cancer [facility])    prostate cancer   Diabetes mellitus without complication ([place])    Hyperlipemia    Vitamin D deficiency     Medications:  No current facility-administered medications on file prior to encounter.   Current Outpatient Medications on File Prior to Encounter  Medication Sig Dispense Refill   Cholecalciferol (VITAMIN D3) 1.25 MG (50000 UT) CAPS Take 1 capsule by mouth daily.     doxazosin (CARDURA) 8 MG tablet Take 8 mg by mouth daily.     glipiZIDE (GLUCOTROL XL) 5 MG 24 hr tablet Take 5 mg by mouth daily with breakfast.     lovastatin (MEVACOR) 40 MG tablet Take 80 mg by mouth at bedtime.     diclofenac Sodium (VOLTAREN) 1 % GEL Apply 2 g topically 4 (four) times daily as needed (pain). 50 g 0   [DISCONTINUED] HYDROcodone-acetaminophen (NORCO/VICODIN) 5-325 MG tablet One tablet every six hours for pain.  Limit 7 days. 28 tablet 0     Assessment: 81 y.o. male with syncope  and elevated troponin, possible ACS, for heparin  HL 0.55- therapeutic  Cardiology has requested to continue heparin infusion for now  Goal of Therapy:  Heparin level 0.3-0.7 units/ml Monitor platelets by anticoagulation protocol: Yes   Plan:  Continue heparin infusion at 1350 units/hr Check H&H and heparin level daily  Margot Ables, PharmD Clinical Pharmacist 07/08/2022 3:19 PM

## 2022-07-08 NOTE — Progress Notes (Signed)
Patient refused insulin today x 2. Notified Dr. Loleta Books , patient states ' I do not take insulin" I did educate him that's how they control diabetes while inpatient and patient did not want insulin.

## 2022-07-08 NOTE — Progress Notes (Signed)
Pt A&O x 4. Pt has reported 0/10 pain. Pt slept through night. Critical result of Troponin 467, [place], MD notified.

## 2022-07-08 NOTE — Assessment & Plan Note (Addendum)
No chest pain or ECG changes.  Demand ischemia and NSTEMI ruled out. 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 - Stop heparin

## 2022-07-08 NOTE — Progress Notes (Signed)
  Echocardiogram 2D Echocardiogram has been performed.  Wynelle Link 07/08/2022, 10:18 AM

## 2022-07-08 NOTE — Progress Notes (Signed)
ANTICOAGULATION CONSULT NOTE - Initial Consult  Pharmacy Consult for Heparin Indication: chest pain/ACS  No Known Allergies  Patient Measurements: Height: '5\' 9"'$  (175.3 cm) Weight: 114.3 kg (252 lb) IBW/kg (Calculated) : 70.7 Heparin Dosing Weight: 100 kg  Vital Signs: Temp: 97.6 F (36.4 C) (11/06 1908) BP: 130/52 (11/07 0100) Pulse Rate: 62 (11/07 0100)  Labs: Recent Labs    07/07/22 2000 07/07/22 2204 07/08/22 0025  HGB 12.3*  --   --   HCT 37.2*  --   --   PLT 129*  --   --   CREATININE 1.52*  --   --   TROPONINIHS 321* 376* 439*    Estimated Creatinine Clearance: 45.9 mL/min (A) (by C-G formula based on SCr of 1.52 mg/dL (H)).   Medical History: Past Medical History:  Diagnosis Date   Arthritis    Cancer [facility])    prostate cancer   Diabetes mellitus without complication ([place])    Hyperlipemia    Vitamin D deficiency     Medications:  No current facility-administered medications on file prior to encounter.   Current Outpatient Medications on File Prior to Encounter  Medication Sig Dispense Refill   Cholecalciferol (VITAMIN D3) 1.25 MG (50000 UT) CAPS Take 1 capsule by mouth daily.     diclofenac Sodium (VOLTAREN) 1 % GEL Apply 2 g topically 4 (four) times daily as needed (pain). 50 g 0   doxazosin (CARDURA) 8 MG tablet Take 8 mg by mouth daily.     glipiZIDE (GLUCOTROL XL) 5 MG 24 hr tablet Take 5 mg by mouth daily with breakfast.     HYDROcodone-acetaminophen (NORCO/VICODIN) 5-325 MG tablet One tablet every six hours for pain.  Limit 7 days. 28 tablet 0   lovastatin (MEVACOR) 40 MG tablet Take 80 mg by mouth at bedtime.       Assessment: 81 y.o. male with syncope and elevated troponin, possible ACS, for heparin  Goal of Therapy:  Heparin level 0.3-0.7 units/ml Monitor platelets by anticoagulation protocol: Yes   Plan:  Heparin 4000 units IV bolus, then start heparin 1350 units/hr Check heparin level in 6 hours.   Caryl Pina 07/08/2022,1:16 AM

## 2022-07-08 NOTE — Assessment & Plan Note (Signed)
A1c 6.4% - Hold glipizide - Continue SS corrections

## 2022-07-08 NOTE — TOC Progression Note (Signed)
  Transition of Care [facility]) Screening Note   Patient Details  Name: Aristidis Talerico Date of Birth: [birth year]   Transition of Care [facility]) CM/SW Contact:    Shade Flood, LCSW Phone Number: 07/08/2022, 8:27 AM    Transition of Care Department [facility]) has reviewed patient and no TOC needs have been identified at this time. We will continue to monitor patient advancement through interdisciplinary progression rounds. If new patient transition needs arise, please place a TOC consult.

## 2022-07-09 ENCOUNTER — Encounter (HOSPITAL_COMMUNITY): Payer: Self-pay | Admitting: Cardiovascular Disease

## 2022-07-09 ENCOUNTER — Encounter (HOSPITAL_COMMUNITY)
Admission: EM | Disposition: A | Discharge status: Home / Self Care | Payer: Self-pay | Source: Home / Self Care | Attending: Cardiovascular Disease

## 2022-07-09 DIAGNOSIS — Z7984 Long term (current) use of oral hypoglycemic drugs: Secondary | ICD-10-CM | POA: Diagnosis not present

## 2022-07-09 DIAGNOSIS — E559 Vitamin D deficiency, unspecified: Secondary | ICD-10-CM | POA: Diagnosis not present

## 2022-07-09 DIAGNOSIS — E669 Obesity, unspecified: Secondary | ICD-10-CM | POA: Diagnosis not present

## 2022-07-09 DIAGNOSIS — I350 Nonrheumatic aortic (valve) stenosis: Secondary | ICD-10-CM

## 2022-07-09 DIAGNOSIS — G8929 Other chronic pain: Secondary | ICD-10-CM | POA: Diagnosis not present

## 2022-07-09 DIAGNOSIS — I130 Hypertensive heart and chronic kidney disease with heart failure and stage 1 through stage 4 chronic kidney disease, or unspecified chronic kidney disease: Secondary | ICD-10-CM | POA: Diagnosis not present

## 2022-07-09 DIAGNOSIS — E119 Type 2 diabetes mellitus without complications: Secondary | ICD-10-CM | POA: Diagnosis not present

## 2022-07-09 DIAGNOSIS — E1165 Type 2 diabetes mellitus with hyperglycemia: Secondary | ICD-10-CM | POA: Diagnosis not present

## 2022-07-09 DIAGNOSIS — J9811 Atelectasis: Secondary | ICD-10-CM | POA: Diagnosis not present

## 2022-07-09 DIAGNOSIS — R55 Syncope and collapse: Secondary | ICD-10-CM | POA: Diagnosis not present

## 2022-07-09 DIAGNOSIS — D649 Anemia, unspecified: Secondary | ICD-10-CM | POA: Diagnosis not present

## 2022-07-09 DIAGNOSIS — Z006 Encounter for examination for normal comparison and control in clinical research program: Secondary | ICD-10-CM | POA: Diagnosis not present

## 2022-07-09 DIAGNOSIS — E782 Mixed hyperlipidemia: Secondary | ICD-10-CM | POA: Diagnosis not present

## 2022-07-09 DIAGNOSIS — Z87891 Personal history of nicotine dependence: Secondary | ICD-10-CM | POA: Diagnosis not present

## 2022-07-09 DIAGNOSIS — I5033 Acute on chronic diastolic (congestive) heart failure: Secondary | ICD-10-CM | POA: Diagnosis not present

## 2022-07-09 DIAGNOSIS — R7989 Other specified abnormal findings of blood chemistry: Secondary | ICD-10-CM | POA: Diagnosis not present

## 2022-07-09 DIAGNOSIS — Z79899 Other long term (current) drug therapy: Secondary | ICD-10-CM | POA: Diagnosis not present

## 2022-07-09 DIAGNOSIS — N1831 Chronic kidney disease, stage 3a: Secondary | ICD-10-CM

## 2022-07-09 DIAGNOSIS — N400 Enlarged prostate without lower urinary tract symptoms: Secondary | ICD-10-CM | POA: Diagnosis not present

## 2022-07-09 DIAGNOSIS — I5A Non-ischemic myocardial injury (non-traumatic): Secondary | ICD-10-CM | POA: Diagnosis not present

## 2022-07-09 DIAGNOSIS — R188 Other ascites: Secondary | ICD-10-CM | POA: Diagnosis not present

## 2022-07-09 DIAGNOSIS — D696 Thrombocytopenia, unspecified: Secondary | ICD-10-CM | POA: Diagnosis not present

## 2022-07-09 DIAGNOSIS — I700 Atherosclerosis of aorta: Secondary | ICD-10-CM | POA: Diagnosis not present

## 2022-07-09 DIAGNOSIS — I2510 Atherosclerotic heart disease of native coronary artery without angina pectoris: Secondary | ICD-10-CM | POA: Diagnosis not present

## 2022-07-09 DIAGNOSIS — Z1152 Encounter for screening for COVID-19: Secondary | ICD-10-CM | POA: Diagnosis not present

## 2022-07-09 DIAGNOSIS — M1990 Unspecified osteoarthritis, unspecified site: Secondary | ICD-10-CM | POA: Diagnosis not present

## 2022-07-09 DIAGNOSIS — Z952 Presence of prosthetic heart valve: Secondary | ICD-10-CM | POA: Diagnosis not present

## 2022-07-09 DIAGNOSIS — Z6838 Body mass index (BMI) 38.0-38.9, adult: Secondary | ICD-10-CM | POA: Diagnosis not present

## 2022-07-09 DIAGNOSIS — N1832 Chronic kidney disease, stage 3b: Secondary | ICD-10-CM | POA: Diagnosis not present

## 2022-07-09 DIAGNOSIS — K08109 Complete loss of teeth, unspecified cause, unspecified class: Secondary | ICD-10-CM | POA: Diagnosis not present

## 2022-07-09 DIAGNOSIS — E1122 Type 2 diabetes mellitus with diabetic chronic kidney disease: Secondary | ICD-10-CM | POA: Diagnosis not present

## 2022-07-09 DIAGNOSIS — I440 Atrioventricular block, first degree: Secondary | ICD-10-CM | POA: Diagnosis not present

## 2022-07-09 HISTORY — PX: RIGHT HEART CATH AND CORONARY ANGIOGRAPHY: CATH118264

## 2022-07-09 LAB — POCT I-STAT EG7
Acid-Base Excess: 0 mmol/L (ref 0.0–2.0)
Acid-Base Excess: 0 mmol/L (ref 0.0–2.0)
Bicarbonate: 26.8 mmol/L (ref 20.0–28.0)
Bicarbonate: 26.8 mmol/L (ref 20.0–28.0)
Calcium, Ion: 1.37 mmol/L (ref 1.15–1.40)
Calcium, Ion: 1.37 mmol/L (ref 1.15–1.40)
HCT: 36 % — ABNORMAL LOW (ref 39.0–52.0)
HCT: 36 % — ABNORMAL LOW (ref 39.0–52.0)
Hemoglobin: 12.2 g/dL — ABNORMAL LOW (ref 13.0–17.0)
Hemoglobin: 12.2 g/dL — ABNORMAL LOW (ref 13.0–17.0)
O2 Saturation: 58 %
O2 Saturation: 59 %
Potassium: 4.1 mmol/L (ref 3.5–5.1)
Potassium: 4.2 mmol/L (ref 3.5–5.1)
Sodium: 140 mmol/L (ref 135–145)
Sodium: 140 mmol/L (ref 135–145)
TCO2: 28 mmol/L (ref 22–32)
TCO2: 28 mmol/L (ref 22–32)
pCO2, Ven: 49.5 mmHg (ref 44–60)
pCO2, Ven: 50.2 mmHg (ref 44–60)
pH, Ven: 7.335 (ref 7.25–7.43)
pH, Ven: 7.342 (ref 7.25–7.43)
pO2, Ven: 32 mmHg (ref 32–45)
pO2, Ven: 33 mmHg (ref 32–45)

## 2022-07-09 LAB — BASIC METABOLIC PANEL
Anion gap: 6 (ref 5–15)
BUN: 15 mg/dL (ref 8–23)
CO2: 28 mmol/L (ref 22–32)
Calcium: 9.6 mg/dL (ref 8.9–10.3)
Chloride: 104 mmol/L (ref 98–111)
Creatinine, Ser: 1.47 mg/dL — ABNORMAL HIGH (ref 0.61–1.24)
GFR, Estimated: 47 mL/min — ABNORMAL LOW (ref >60)
Glucose, Bld: 132 mg/dL — ABNORMAL HIGH (ref 70–99)
Potassium: 4.1 mmol/L (ref 3.5–5.1)
Sodium: 138 mmol/L (ref 135–145)

## 2022-07-09 LAB — CBC
HCT: 36.4 % — ABNORMAL LOW (ref 39.0–52.0)
Hemoglobin: 11.9 g/dL — ABNORMAL LOW (ref 13.0–17.0)
MCH: 32.7 pg (ref 26.0–34.0)
MCHC: 32.7 g/dL (ref 30.0–36.0)
MCV: 100 fL (ref 80.0–100.0)
Platelets: 127 10*3/uL — ABNORMAL LOW (ref 150–400)
RBC: 3.64 MIL/uL — ABNORMAL LOW (ref 4.22–5.81)
RDW: 12.3 % (ref 11.5–15.5)
WBC: 6 10*3/uL (ref 4.0–10.5)
nRBC: 0 % (ref 0.0–0.2)

## 2022-07-09 LAB — GLUCOSE, CAPILLARY
Glucose-Capillary: 142 mg/dL — ABNORMAL HIGH (ref 70–99)
Glucose-Capillary: 122 mg/dL — ABNORMAL HIGH (ref 70–99)
Glucose-Capillary: 97 mg/dL (ref 70–99)

## 2022-07-09 LAB — POCT I-STAT 7, (LYTES, BLD GAS, ICA,H+H)
Acid-Base Excess: 0 mmol/L (ref 0.0–2.0)
Bicarbonate: 26.2 mmol/L (ref 20.0–28.0)
Calcium, Ion: 1.38 mmol/L (ref 1.15–1.40)
HCT: 36 % — ABNORMAL LOW (ref 39.0–52.0)
Hemoglobin: 12.2 g/dL — ABNORMAL LOW (ref 13.0–17.0)
O2 Saturation: 96 %
Potassium: 4.2 mmol/L (ref 3.5–5.1)
Sodium: 140 mmol/L (ref 135–145)
TCO2: 28 mmol/L (ref 22–32)
pCO2 arterial: 49.3 mmHg — ABNORMAL HIGH (ref 32–48)
pH, Arterial: 7.334 — ABNORMAL LOW (ref 7.35–7.45)
pO2, Arterial: 90 mmHg (ref 83–108)

## 2022-07-09 LAB — LIPID PANEL
Cholesterol: 135 mg/dL (ref 0–200)
HDL: 45 mg/dL (ref >40)
LDL Cholesterol: 80 mg/dL (ref 0–99)
Total CHOL/HDL Ratio: 3 RATIO
Triglycerides: 48 mg/dL (ref <150)
VLDL: 10 mg/dL (ref 0–40)

## 2022-07-09 SURGERY — RIGHT HEART CATH AND CORONARY ANGIOGRAPHY
Anesthesia: LOCAL

## 2022-07-09 MED ORDER — MIDAZOLAM HCL 2 MG/2ML IJ SOLN
INTRAMUSCULAR | Status: DC | PRN
  Administered 2022-07-09: 1 mg via INTRAVENOUS

## 2022-07-09 MED ORDER — ASPIRIN 81 MG PO CHEW
81.0000 mg | CHEWABLE_TABLET | ORAL | Status: AC
  Administered 2022-07-09: 81 mg via ORAL
  Filled 2022-07-09: qty 1

## 2022-07-09 MED ORDER — ASPIRIN 81 MG PO CHEW: 81.0000 mg | CHEWABLE_TABLET | ORAL | Status: DC

## 2022-07-09 MED ORDER — ASPIRIN 81 MG PO TBEC
81.0000 mg | DELAYED_RELEASE_TABLET | Freq: Every day | ORAL | Status: DC
  Administered 2022-07-10 – 2022-07-16 (×7): 81 mg via ORAL
  Filled 2022-07-09 (×7): qty 1

## 2022-07-09 MED ORDER — SODIUM CHLORIDE 0.9 % WEIGHT BASED INFUSION: 3.0000 mL/kg/h | INTRAVENOUS | Status: DC

## 2022-07-09 MED ORDER — FENTANYL CITRATE (PF) 100 MCG/2ML IJ SOLN
INTRAMUSCULAR | Status: AC
  Filled 2022-07-09: qty 2

## 2022-07-09 MED ORDER — SODIUM CHLORIDE 0.9 % WEIGHT BASED INFUSION: 1.0000 mL/kg/h | INTRAVENOUS | Status: DC

## 2022-07-09 MED ORDER — HEPARIN (PORCINE) IN NACL 1000-0.9 UT/500ML-% IV SOLN
INTRAVENOUS | Status: AC
  Filled 2022-07-09: qty 500

## 2022-07-09 MED ORDER — ASPIRIN 81 MG PO CHEW: 81.0000 mg | CHEWABLE_TABLET | Freq: Every day | ORAL | Status: DC

## 2022-07-09 MED ORDER — LABETALOL HCL 5 MG/ML IV SOLN: 10.0000 mg | INTRAVENOUS | Status: AC | PRN

## 2022-07-09 MED ORDER — ONDANSETRON HCL 4 MG/2ML IJ SOLN: 4.0000 mg | Freq: Four times a day (QID) | INTRAMUSCULAR | Status: DC | PRN

## 2022-07-09 MED ORDER — LIDOCAINE HCL (PF) 1 % IJ SOLN
INTRAMUSCULAR | Status: AC
  Filled 2022-07-09: qty 30

## 2022-07-09 MED ORDER — SODIUM CHLORIDE 0.9% FLUSH
3.0000 mL | Freq: Two times a day (BID) | INTRAVENOUS | Status: DC
  Administered 2022-07-10 – 2022-07-13 (×9): 3 mL via INTRAVENOUS

## 2022-07-09 MED ORDER — SODIUM CHLORIDE 0.9 % WEIGHT BASED INFUSION
3.0000 mL/kg/h | INTRAVENOUS | Status: DC
  Administered 2022-07-09: 3 mL/kg/h via INTRAVENOUS

## 2022-07-09 MED ORDER — SODIUM CHLORIDE 0.9% FLUSH: 3.0000 mL | INTRAVENOUS | Status: DC | PRN

## 2022-07-09 MED ORDER — HEPARIN (PORCINE) IN NACL 1000-0.9 UT/500ML-% IV SOLN
INTRAVENOUS | Status: DC | PRN
  Administered 2022-07-09 (×2): 500 mL

## 2022-07-09 MED ORDER — LIDOCAINE HCL (PF) 1 % IJ SOLN
INTRAMUSCULAR | Status: DC | PRN
  Administered 2022-07-09: 5 mL

## 2022-07-09 MED ORDER — SODIUM CHLORIDE 0.9 % IV SOLN: 250.0000 mL | INTRAVENOUS | Status: DC | PRN

## 2022-07-09 MED ORDER — SODIUM CHLORIDE 0.9 % IV SOLN: INTRAVENOUS | Status: AC

## 2022-07-09 MED ORDER — IOHEXOL 350 MG/ML SOLN
INTRAVENOUS | Status: DC | PRN
  Administered 2022-07-09: 52 mL

## 2022-07-09 MED ORDER — HEPARIN SODIUM (PORCINE) 1000 UNIT/ML IJ SOLN
INTRAMUSCULAR | Status: AC
  Filled 2022-07-09: qty 10

## 2022-07-09 MED ORDER — HYDRALAZINE HCL 20 MG/ML IJ SOLN: 10.0000 mg | INTRAMUSCULAR | Status: AC | PRN

## 2022-07-09 MED ORDER — MIDAZOLAM HCL 2 MG/2ML IJ SOLN
INTRAMUSCULAR | Status: AC
  Filled 2022-07-09: qty 2

## 2022-07-09 MED ORDER — HEPARIN SODIUM (PORCINE) 1000 UNIT/ML IJ SOLN
INTRAMUSCULAR | Status: DC | PRN
  Administered 2022-07-09: 6000 [IU] via INTRAVENOUS

## 2022-07-09 MED ORDER — FENTANYL CITRATE (PF) 100 MCG/2ML IJ SOLN
INTRAMUSCULAR | Status: DC | PRN
  Administered 2022-07-09: 25 ug via INTRAVENOUS

## 2022-07-09 MED ORDER — ACETAMINOPHEN 325 MG PO TABS: 650.0000 mg | ORAL_TABLET | ORAL | Status: DC | PRN

## 2022-07-09 MED ORDER — VERAPAMIL HCL 2.5 MG/ML IV SOLN
INTRAVENOUS | Status: DC | PRN
  Administered 2022-07-09: 10 mL via INTRA_ARTERIAL

## 2022-07-09 MED ORDER — VERAPAMIL HCL 2.5 MG/ML IV SOLN
INTRAVENOUS | Status: AC
  Filled 2022-07-09: qty 2

## 2022-07-09 SURGICAL SUPPLY — 12 items
BAND ZEPHYR COMPRESS 30 LONG (HEMOSTASIS) IMPLANT
CATH OPTITORQUE TIG 4.0 5F (CATHETERS) IMPLANT
CATH SWAN GANZ 7F STRAIGHT (CATHETERS) IMPLANT
GLIDESHEATH SLEND SS 6F .021 (SHEATH) IMPLANT
GLIDESHEATH SLENDER 7FR .021G (SHEATH) IMPLANT
GUIDEWIRE INQWIRE 1.5J.035X260 (WIRE) IMPLANT
INQWIRE 1.5J .035X260CM (WIRE) ×1
KIT HEART LEFT (KITS) ×1 IMPLANT
PACK CARDIAC CATHETERIZATION (CUSTOM PROCEDURE TRAY) ×1 IMPLANT
SHEATH PROBE COVER 6X72 (BAG) IMPLANT
TRANSDUCER W/STOPCOCK (MISCELLANEOUS) ×1 IMPLANT
TUBING CIL FLEX 10 FLL-RA (TUBING) ×1 IMPLANT

## 2022-07-09 NOTE — Progress Notes (Addendum)
Rounding Note    Patient Name: Gerald Crosby Date of Encounter: 07/09/2022  [place] Cardiologist: Dr. Sabra Heck - [place], [place]  Subjective   Has been NPO since midnight for cardiac catheterization today. No chest pain or dyspnea. No recurrent syncope. [facility] at the bedside for transfer to [facility].    Inpatient Medications    Scheduled Meds:  [START ON 07/10/2022] aspirin EC  81 mg Oral Daily   insulin aspart  0-15 Units Subcutaneous TID WC   insulin aspart  0-5 Units Subcutaneous QHS   insulin glargine-yfgn  10 Units Subcutaneous QHS   pravastatin  40 mg Oral q1800   Continuous Infusions:  sodium chloride     heparin 1,350 Units/hr (07/08/22 1627)   PRN Meds: acetaminophen **OR** acetaminophen, diclofenac Sodium, ondansetron **OR** ondansetron (ZOFRAN) IV   Vital Signs    Vitals:   07/08/22 0926 07/08/22 1729 07/08/22 2119 07/09/22 0550  BP: (!) 124/55 134/67 (!) 140/53 (!) 145/61  Pulse: 62 (!) 56 (!) 56 61  Resp: '19 18 20 16  '$ Temp: 98.6 F (37 C) 98.6 F (37 C) 98 F (36.7 C) (!) 97.1 F (36.2 C)  TempSrc: Oral     SpO2: 99% 99% 98% 98%  Weight:    116.1 kg  Height:        Intake/Output Summary (Last 24 hours) at 07/09/2022 0830 Last data filed at 07/09/2022 0500 Gross per 24 hour  Intake 720 ml  Output --  Net 720 ml      07/09/2022    5:50 AM 07/08/2022    1:58 AM 07/08/2022    1:34 AM  Last 3 Weights  Weight (lbs) 255 lb 15.3 oz 259 lb 7.7 oz 259 lb 7.7 oz  Weight (kg) 116.1 kg 117.7 kg 117.7 kg      Telemetry    Sinus bradycardia, HR in 50's to 60's.  - Personally Reviewed  ECG    No new tracings.   Physical Exam   GEN: Pleasant male appearing in no acute distress.   Neck: No JVD Cardiac: Regular rhythm, bradycardiac rate. 3/6 systolic murmur along RUSB.  Respiratory: Clear to auscultation bilaterally without wheezing or rales. GI: Soft, nontender, non-distended  MS: 1+ pitting edema bilaterally; No deformity. Neuro:   Nonfocal  Psych: Normal affect   Labs    High Sensitivity Troponin:   Recent Labs  Lab 07/07/22 2204 07/08/22 0025 07/08/22 0207 07/08/22 0343 07/08/22 0650  TROPONINIHS 376* 439* 467* 461* 495*     Chemistry Recent Labs  Lab 07/07/22 2000 07/08/22 0207 07/09/22 0424  NA 136 136 138  K 4.2 4.1 4.1  CL 102 105 104  CO2 '27 27 28  '$ GLUCOSE 247* 220* 132*  BUN '17 15 15  '$ CREATININE 1.52* 1.39* 1.47*  CALCIUM 9.5 9.4 9.6  MG 2.0 1.9  --   PROT 7.2 7.0  --   ALBUMIN 3.9 3.9  --   AST 23 21  --   ALT 17 17  --   ALKPHOS 82 76  --   BILITOT 0.8 0.9  --   GFRNONAA 45* 50* 47*  ANIONGAP 7 4* 6    Lipids  Recent Labs  Lab 07/09/22 0424  CHOL 135  TRIG 48  HDL 45  LDLCALC 80  CHOLHDL 3.0    Hematology Recent Labs  Lab 07/07/22 2000 07/08/22 0207 07/09/22 0424  WBC 6.5 7.0 6.0  RBC 3.73* 3.73* 3.64*  HGB 12.3* 12.3* 11.9*  HCT 37.2*  36.9* 36.4*  MCV 99.7 98.9 100.0  MCH 33.0 33.0 32.7  MCHC 33.1 33.3 32.7  RDW 12.2 12.2 12.3  PLT 129* 136* 127*   Thyroid No results for input(s): "TSH", "FREET4" in the last 168 hours.  BNP Recent Labs  Lab 07/07/22 2000  BNP 146.0*    DDimer  Recent Labs  Lab 07/08/22 0650  DDIMER 0.52*     Radiology    US Carotid Bilateral  Result Date: 07/08/2022 CLINICAL DATA:  81 year old male with history of syncope. EXAM: BILATERAL CAROTID DUPLEX ULTRASOUND TECHNIQUE: Pearline Cables scale imaging, color Doppler and duplex ultrasound were performed of bilateral carotid and vertebral arteries in the neck. COMPARISON:  None Available. FINDINGS: Criteria: Quantification of carotid stenosis is based on velocity parameters that correlate the residual internal carotid diameter with NASCET-based stenosis levels, using the diameter of the distal internal carotid lumen as the denominator for stenosis measurement. The following velocity measurements were obtained: RIGHT ICA: Peak systolic velocity 78 cm/sec, End diastolic velocity 20 cm/sec CCA:  Peak systolic velocity 88 cm/sec SYSTOLIC ICA/CCA RATIO:  0.9 ECA: Peak systolic velocity 621 cm/sec LEFT ICA: Peak systolic velocity 89 cm/sec, End diastolic velocity 27 cm/sec CCA: 308 cm/sec SYSTOLIC ICA/CCA RATIO:  0.8 ECA: 128 cm/sec RIGHT CAROTID ARTERY: Moderate multifocal atherosclerotic plaque formation, most prominent at the carotid bulb. No significant tortuosity. Normal low resistance waveforms. RIGHT VERTEBRAL ARTERY:  Antegrade flow. LEFT CAROTID ARTERY: Moderate multifocal atherosclerotic plaque formation, most prominent about the carotid bulb. No significant tortuosity. Normal low resistance waveforms. LEFT VERTEBRAL ARTERY:  Antegrade flow. Upper extremity non-invasive blood pressures: Not obtained. IMPRESSION: 1. Right carotid artery system: Less than 50% stenosis secondary to moderate multifocal atherosclerotic plaque formation. 2. Left carotid artery system: Less than 50% stenosis secondary to moderate multifocal atherosclerotic plaque formation. 3.  Vertebral artery system: Patent with antegrade flow bilaterally. Ruthann Cancer, MD [facility] Electronically Signed   By: Ruthann Cancer M.D.   On: 07/08/2022 09:53   DG Chest Port 1 View  Result Date: 07/07/2022 CLINICAL DATA:  Syncope. EXAM: PORTABLE CHEST 1 VIEW COMPARISON:  None Available. FINDINGS: Normal cardiomediastinal contours. Lung volumes are low with slight asymmetric elevation of left hemidiaphragm. There are linear opacities identified within the left lower lung which are favored to represent areas of scarring or atelectasis. No signs of pleural effusion or interstitial edema. No airspace opacities. IMPRESSION: Low lung volumes with linear opacities within the left lower lung favored to represent scarring or atelectasis. Electronically Signed   By: Kerby Moors M.D.   On: 07/07/2022 20:23    Cardiac Studies   Echocardiogram: 07/08/2022 IMPRESSIONS     1. Left  ventricular ejection fraction, by estimation, is 60 to 65%. The  left ventricle has normal function. The left ventricle has no regional  wall motion abnormalities. There is severe left ventricular hypertrophy.  Left ventricular diastolic parameters   are indeterminate.   2. Right ventricular systolic function is normal. The right ventricular  size is normal. There is normal pulmonary artery systolic pressure.   3. There is severe calcifcation of the aortic valve. There is evidence of  critical aortic valve stenosis with Vmax 5.7 m/sec, mean pressure gradient  73 mm Hg and aortic valve area of 0.4 cm2 and trivial aortic valve  regurgitation.   4. The mitral valve is grossly normal. Trivial mitral valve  regurgitation. No evidence of mitral stenosis.   5. The inferior vena cava is dilated in size  with <50% respiratory  variability, suggesting right atrial pressure of 15 mmHg.    Patient Profile     81 y.o. male w/PMH of HTN, HLD and Type 2 DM who is currently admitted for evaluation of syncope. Found to have elevated Hs Troponin values of 495 and severe AS by echocardiogram.   Assessment & Plan    1. Syncope/Elevated Troponin Values/Aortic Stenosis - Presented with 2 syncopal episodes the day of admission with reported dizziness prior to the events but no anginal symptoms. Hs Troponin values were up to 495 and echocardiogram showed a preserved EF of 60-65% but found to have severe/critical AS with mean gradient of 73 mmHg and AVA 0.4 cm2.  - Dr. Dellia Cloud reviewed findings with the patient yesterday and recommended a [facility] for further evaluation. Risks and benefits reviewed and he was in agreement to proceed. He does have some pitting edema today and is currently receiving IV fluids given his CKD. Will likely require diuresis following cath. He will be on the Cardiology service at [facility] and the Structural Heart Team is aware of his transfer.  - Remains on IV Heparin for now given his elevated  enzymes but likely secondary to his aortic stenosis and not ACS. Continue ASA '81mg'$  daily and statin therapy. No beta-blocker given baseline HR in the 50's.   2. HTN - BP has overall been stable at 124/55 - 145/61 in the past 24 hours. Was on Cardura prior to admission but currently held. Will need adjustments in medical therapy following his catheterization today.     3. HLD - FLP this admission shows total cholesterol of 135, triglycerides 48, HDL 45 and LDL 80. Was on Lovastatin '80mg'$  daily prior to admission and ordered as Pravastatin '40mg'$  daily given hospital formulary. If found to have CAD by catheterization, would switch to Crestor '20mg'$  daily with repeat FLP and LFT's in 6-8 weeks.    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 Unknown baseline given no recent values for comparison. Follow closely given cath today. He has been receiving pre-procedure fluids this AM.   5. Type 2 DM - Hgb A1c at 6.4 this admission. On Glipizide prior to admission but currently held. SSI ordered but patient has refused doses. Glucose 142 this AM but NPO for his procedure.    For questions or updates, please contact [place] Please consult [email or web address] for contact info under        Signed, Erma Heritage, PA-C  07/09/2022, 8:30 AM     Patient seen and examined. Agree with assessment and plan.  Gerald Crosby has arrived at [facility] for his diagnostic right and left heart cardiac catheterization.  Presently he is without chest pain.  His echo Doppler study has shown severe/critical aortic stenosis mean gradient of 73 mmHg and estimated AVA 0.4 cm.  He recently developed to presyncopal/syncopal episodes leading to his presentation to hospital.  Creatinine today improved at 1.47 from 1.52.  Troponin levels are elevated with flat plateau suggestive of demand ischemia.The risks and benefits of a cardiac catheterization including, but not limited to, death,  stroke, MI, kidney damage and bleeding were discussed with the patient who indicates understanding and agrees to proceed.     Troy Sine, MD, [facility] 07/09/2022 11:35 AM

## 2022-07-09 NOTE — Progress Notes (Signed)
Received from [facility] by [facility] to cath lab holding room 6. Alert and oriented. Denies discomfort. Arrived w/Heparin infusing at 1350 units/hour w/0.9NS at 117cc/hr.

## 2022-07-09 NOTE — Consult Note (Signed)
Cardiology Consultation   Patient ID: Gerald Crosby MRN: [id number]; DOB: December 02, 1940  Admit date: 07/07/2022 Date of Consult: 07/09/2022  PCP:  Ludwig Clarks, [place] Providers Cardiologist:  None        Patient Profile:   Gerald Crosby is a 81 y.o. male transferred for further evaluation of severe aortic stenosis  History of Present Illness:   Gerald Crosby presented to [facility] emergency department 07/07/2022 after experiencing syncope.  He had 2 episodes on the day of admission where he experienced profound lightheadedness.  Earlier in the day EMS had been called out to his house and he was demonstrated to have orthostatic hypotension but he declined transportation to the hospital.  Later in the day, he went out to do some light yard work and developed severe dizziness and weakness.  He tells [place] that he sat down on a bench, but it is reported that his wife found him down in the yard.  They estimated that his duration of loss of consciousness was about 1 minute.  He had a similar episode earlier this year that occurred when he was walking back and forth from his mailbox.  He otherwise denies chest pressure, chest pain, or shortness of breath with activity.  He is not very active because of some problems with his knees.  He uses a walker or grocery cart at the store.  He denies orthopnea, PND, or heart palpitations.  States that he has had a heart murmur for many years.  An echocardiogram was completed during his initial evaluation and it demonstrated preserved LV systolic function, normal RV function, and critical aortic stenosis.  The patient is transferred to [facility] for structural heart evaluation, cardiac catheterization, and consideration of treatment options for severe aortic stenosis.  He underwent cardiac catheterization this morning that demonstrated patent coronary arteries with mild nonobstructive CAD.  His right heart pressures were noted to be  elevated.  See findings below for details.  The patient is otherwise been in reasonably good health.  He lives independently with his wife who actually underwent TAVR in 2020.  He denies any other major medical problems.  He had a history of prostate cancer and was treated with radiation therapy.  He has not had any major chest or abdominal surgeries.  He denies any history of stroke or TIA.  He denies any history of bleeding or blood transfusion.   Past Medical History:  Diagnosis Date   Arthritis    Cancer ([place])    prostate cancer   Diabetes mellitus without complication ([place])    Hyperlipemia    Vitamin D deficiency     Past Surgical History:  Procedure Laterality Date   right knee sx       Home Medications:  Prior to Admission medications   Medication Sig Start Date End Date Taking? Authorizing Provider  Cholecalciferol (VITAMIN D3) 1.25 MG (50000 UT) CAPS Take 1 capsule by mouth daily.   Yes [provider]  doxazosin (CARDURA) 8 MG tablet Take 8 mg by mouth daily.   Yes [provider]  glipiZIDE (GLUCOTROL XL) 5 MG 24 hr tablet Take 5 mg by mouth daily with breakfast.   Yes [provider]  lovastatin (MEVACOR) 40 MG tablet Take 80 mg by mouth at bedtime.   Yes [provider]  diclofenac Sodium (VOLTAREN) 1 % GEL Apply 2 g topically 4 (four) times daily as needed (pain). 02/20/20   Long, [facility],  MD    Inpatient Medications: Scheduled Meds:  [START ON 07/10/2022] aspirin EC  81 mg Oral Daily   insulin aspart  0-15 Units Subcutaneous TID WC   insulin aspart  0-5 Units Subcutaneous QHS   insulin glargine-yfgn  10 Units Subcutaneous QHS   pravastatin  40 mg Oral q1800   sodium chloride flush  3 mL Intravenous Q12H   Continuous Infusions:  sodium chloride Stopped (07/09/22 1517)   sodium chloride     heparin Stopped (07/09/22 1130)   PRN Meds: sodium chloride, acetaminophen, diclofenac Sodium, hydrALAZINE, labetalol, ondansetron **OR**  ondansetron (ZOFRAN) IV, sodium chloride flush  Allergies:   No Known Allergies  Social History:   Social History   Socioeconomic History   Marital status: Married    Spouse name: Not on file   Number of children: Not on file   Years of education: Not on file   Highest education level: Not on file  Occupational History   Not on file  Tobacco Use   Smoking status: Former   Smokeless tobacco: Never  Vaping Use   Vaping Use: Never used  Substance and Sexual Activity   Alcohol use: Never   Drug use: Never   Sexual activity: Not on file  Other Topics Concern   Not on file  Social History Narrative   Not on file   Social Determinants of Health   Financial Resource Strain: Not on file  Food Insecurity: No Food Insecurity (07/09/2022)   Hunger Vital Sign    Worried About Running Out of Food in the Last Year: Never true    Ran Out of Food in the Last Year: Never true  Transportation Needs: No Transportation Needs (07/09/2022)   PRAPARE - Hydrologist (Medical): No    Lack of Transportation (Non-Medical): No  Physical Activity: Not on file  Stress: Not on file  Social Connections: Not on file  Intimate Partner Violence: Not At Risk (07/09/2022)   Humiliation, Afraid, Rape, and Kick questionnaire    Fear of Current or Ex-Partner: No    Emotionally Abused: No    Physically Abused: No    Sexually Abused: No    Family History:   Family History  Problem Relation Age of Onset   Heart murmur Mother      ROS:  Please see the history of present illness.  All other ROS reviewed and negative.     Physical Exam/Data:   Vitals:   07/09/22 1400 07/09/22 1415 07/09/22 1428 07/09/22 1445  BP: (!) 116/95 (!) 149/52 (!) 132/55 (!) 125/59  Pulse: 72 (!) 59 63   Resp: 20 (!) 23 (!) 25 20  Temp:    97.6 F (36.4 C)  TempSrc:    Oral  SpO2: 98% 98% 96%   Weight:      Height:        Intake/Output Summary (Last 24 hours) at 07/09/2022 1628 Last data  filed at 07/09/2022 0500 Gross per 24 hour  Intake 240 ml  Output --  Net 240 ml      07/09/2022    5:50 AM 07/08/2022    1:58 AM 07/08/2022    1:34 AM  Last 3 Weights  Weight (lbs) 255 lb 15.3 oz 259 lb 7.7 oz 259 lb 7.7 oz  Weight (kg) 116.1 kg 117.7 kg 117.7 kg     Body mass index is 37.8 kg/m.  General:  Well nourished, well developed, obese in no acute distress HEENT: normal  Neck: no JVD Vascular: Bilateral carotid bruits; Distal pulses 2+ bilaterally Cardiac:  normal S1, S2; regular rate and rhythm with a grade 3/6 harsh late peaking crescendo decrescendo murmur at the right upper sternal border, absent A2 Lungs:  clear to auscultation bilaterally, no wheezing, rhonchi or rales  Abd: soft, nontender, no hepatomegaly  Ext: Trace bilateral pretibial edema Musculoskeletal:  No deformities, BUE and BLE strength normal and equal Skin: warm and dry  Neuro:  CNs 2-12 intact, no focal abnormalities noted Psych:  Normal affect   EKG:  The EKG was personally reviewed and demonstrates: Normal sinus rhythm 79 bpm, within normal limits Telemetry:  Telemetry was personally reviewed and demonstrates: Normal sinus rhythm without significant arrhythmia  Relevant CV Studies: 2D echocardiogram:  1. Left ventricular ejection fraction, by estimation, is 60 to 65%. The  left ventricle has normal function. The left ventricle has no regional  wall motion abnormalities. There is severe left ventricular hypertrophy.  Left ventricular diastolic parameters   are indeterminate.   2. Right ventricular systolic function is normal. The right ventricular  size is normal. There is normal pulmonary artery systolic pressure.   3. There is severe calcifcation of the aortic valve. There is evidence of  critical aortic valve stenosis with Vmax 5.7 m/sec, mean pressure gradient  73 mm Hg and aortic valve area of 0.4 cm2 and trivial aortic valve  regurgitation.   4. The mitral valve is grossly normal. Trivial  mitral valve  regurgitation. No evidence of mitral stenosis.   5. The inferior vena cava is dilated in size with <50% respiratory  variability, suggesting right atrial pressure of 15 mmHg.   Laboratory Data:  High Sensitivity Troponin:   Recent Labs  Lab 07/07/22 2204 07/08/22 0025 07/08/22 0207 07/08/22 0343 07/08/22 0650  TROPONINIHS 376* 439* 467* 461* 495*     Chemistry Recent Labs  Lab 07/07/22 2000 07/08/22 0207 07/09/22 0424  NA 136 136 138  K 4.2 4.1 4.1  CL 102 105 104  CO2 '27 27 28  '$ GLUCOSE 247* 220* 132*  BUN '17 15 15  '$ CREATININE 1.52* 1.39* 1.47*  CALCIUM 9.5 9.4 9.6  MG 2.0 1.9  --   GFRNONAA 45* 50* 47*  ANIONGAP 7 4* 6    Recent Labs  Lab 07/07/22 2000 07/08/22 0207  PROT 7.2 7.0  ALBUMIN 3.9 3.9  AST 23 21  ALT 17 17  ALKPHOS 82 76  BILITOT 0.8 0.9   Lipids  Recent Labs  Lab 07/09/22 0424  CHOL 135  TRIG 48  HDL 45  LDLCALC 80  CHOLHDL 3.0    Hematology Recent Labs  Lab 07/07/22 2000 07/08/22 0207 07/09/22 0424  WBC 6.5 7.0 6.0  RBC 3.73* 3.73* 3.64*  HGB 12.3* 12.3* 11.9*  HCT 37.2* 36.9* 36.4*  MCV 99.7 98.9 100.0  MCH 33.0 33.0 32.7  MCHC 33.1 33.3 32.7  RDW 12.2 12.2 12.3  PLT 129* 136* 127*   Thyroid No results for input(s): "TSH", "FREET4" in the last 168 hours.  BNP Recent Labs  Lab 07/07/22 2000  BNP 146.0*    DDimer  Recent Labs  Lab 07/08/22 0650  DDIMER 0.52*     Radiology/Studies:  CARDIAC CATHETERIZATION  Result Date: 07/09/2022   Dist Cx lesion is 10% stenosed.   Mid LAD lesion is 10% stenosed. No significant coronary obstructive disease with large relatively normal LAD, ramus intermediate, left circumflex, and RCA. Severely calcified aortic valve with significant restriction of motion. No attempt to  cross the aortic valve with echo Doppler data from yesterday showing critical AS with a mean gradient of 73 mmHg, peak to peak gradient of 107 mmHg, and aortic valve area estimate of 0.4 to 0.5 cm.  RECOMMENDATION: Patient will be admitted.  Surgical consultation for aortic valve replacement.  Will need scheduling with CT imaging per structural team.   ECHOCARDIOGRAM COMPLETE  Result Date: 07/08/2022    ECHOCARDIOGRAM REPORT   Patient Name:   ANDRUW BATTIE Date of Exam: 07/08/2022 Medical Rec #:  [id number]       Height:       69.0 in Accession #:    [id number]      Weight:       259.5 lb Date of Birth:  08/03/1941       BSA:          2.308 m Patient Age:    23 years        BP:           124/55 mmHg Patient Gender: M               HR:           71 bpm. Exam Location:  Inpatient Procedure: 2D Echo, 3D Echo, Cardiac Doppler and Color Doppler Indications:    Syncope R55  History:        Patient has no prior history of Echocardiogram examinations.                 Signs/Symptoms:Syncope; Risk Factors:Former Smoker, Diabetes and                 Dyslipidemia.  Sonographer:    Greer Pickerel Referring Phys: [id number] OLADAPO ADEFESO  Sonographer Comments: Patient is obese. Image acquisition challenging due to respiratory motion. IMPRESSIONS  1. Left ventricular ejection fraction, by estimation, is 60 to 65%. The left ventricle has normal function. The left ventricle has no regional wall motion abnormalities. There is severe left ventricular hypertrophy. Left ventricular diastolic parameters  are indeterminate.  2. Right ventricular systolic function is normal. The right ventricular size is normal. There is normal pulmonary artery systolic pressure.  3. There is severe calcifcation of the aortic valve. There is evidence of critical aortic valve stenosis with Vmax 5.7 m/sec, mean pressure gradient 73 mm Hg and aortic valve area of 0.4 cm2 and trivial aortic valve regurgitation.  4. The mitral valve is grossly normal. Trivial mitral valve regurgitation. No evidence of mitral stenosis.  5. The inferior vena cava is dilated in size with <50% respiratory variability, suggesting right atrial pressure of 15 mmHg. FINDINGS   Left Ventricle: Left ventricular ejection fraction, by estimation, is 60 to 65%. The left ventricle has normal function. The left ventricle has no regional wall motion abnormalities. The left ventricular internal cavity size was normal in size. There is  severe left ventricular hypertrophy. Left ventricular diastolic parameters are indeterminate. Right Ventricle: The right ventricular size is normal. No increase in right ventricular wall thickness. Right ventricular systolic function is normal. There is normal pulmonary artery systolic pressure. The tricuspid regurgitant velocity is 1.97 m/s, and  with an assumed right atrial pressure of 8 mmHg, the estimated right ventricular systolic pressure is 18.2 mmHg. Left Atrium: Left atrial size was normal in size. Right Atrium: Right atrial size was normal in size. Pericardium: There is no evidence of pericardial effusion. Mitral Valve: The mitral valve is grossly normal. Trivial mitral valve regurgitation. No evidence of mitral valve stenosis. Tricuspid Valve:  The tricuspid valve is not well visualized. Tricuspid valve regurgitation is not demonstrated. No evidence of tricuspid stenosis. Aortic Valve: There is severe calcifcation of the aortic valve. There is severe aortic valve annular calcification. Aortic valve regurgitation is trivial. Severe aortic stenosis is present. Aortic valve mean gradient measures 59.7 mmHg. Aortic valve peak  gradient measures 107.1 mmHg. Aortic valve area, by VTI measures 0.48 cm. Pulmonic Valve: The pulmonic valve was not well visualized. Pulmonic valve regurgitation is not visualized. No evidence of pulmonic stenosis. Aorta: The aortic root is normal in size and structure. Venous: The inferior vena cava is dilated in size with less than 50% respiratory variability, suggesting right atrial pressure of 15 mmHg. IAS/Shunts: No atrial level shunt detected by color flow Doppler.  LEFT VENTRICLE PLAX 2D LVIDd:         4.60 cm   Diastology  LVIDs:         3.20 cm   LV e' medial:    4.46 cm/s LV PW:         1.20 cm   LV E/e' medial:  20.9 LV IVS:        0.90 cm   LV e' lateral:   5.22 cm/s LVOT diam:     2.00 cm   LV E/e' lateral: 17.9 LV SV:         60 LV SV Index:   26 LVOT Area:     3.14 cm                           3D Volume EF:                          3D EF:        57 %                          LV EDV:       242 ml                          LV ESV:       105 ml                          LV SV:        137 ml RIGHT VENTRICLE RV S prime:     12.30 cm/s TAPSE (M-mode): 2.3 cm LEFT ATRIUM           Index        RIGHT ATRIUM           Index LA diam:      4.50 cm 1.95 cm/m   RA Area:     11.30 cm LA Vol (A2C): 68.4 ml 29.64 ml/m  RA Volume:   22.70 ml  9.84 ml/m LA Vol (A4C): 73.5 ml 31.85 ml/m  AORTIC VALVE AV Area (Vmax):    0.56 cm AV Area (Vmean):   0.54 cm AV Area (VTI):     0.48 cm AV Vmax:           517.33 cm/s AV Vmean:          357.667 cm/s AV VTI:            1.240 m AV Peak Grad:      107.1 mmHg AV Mean Grad:  59.7 mmHg LVOT Vmax:         93.00 cm/s LVOT Vmean:        61.300 cm/s LVOT VTI:          0.191 m LVOT/AV VTI ratio: 0.15  AORTA Ao Root diam: 3.60 cm Ao Asc diam:  3.80 cm MITRAL VALVE                TRICUSPID VALVE MV Area (PHT): 3.03 cm     TR Peak grad:   15.5 mmHg MV Decel Time: 250 msec     TR Vmax:        197.00 cm/s MV E velocity: 93.40 cm/s MV A velocity: 111.00 cm/s  SHUNTS MV E/A ratio:  0.84         Systemic VTI:  0.19 m                             Systemic Diam: 2.00 cm Vishnu Priya Mallipeddi Electronically signed by Lorelee Cover Mallipeddi Signature Date/Time: 07/08/2022/12:25:11 PM    Final    US Carotid Bilateral  Result Date: 07/08/2022 CLINICAL DATA:  81 year old male with history of syncope. EXAM: BILATERAL CAROTID DUPLEX ULTRASOUND TECHNIQUE: Pearline Cables scale imaging, color Doppler and duplex ultrasound were performed of bilateral carotid and vertebral arteries in the neck. COMPARISON:  None Available.  FINDINGS: Criteria: Quantification of carotid stenosis is based on velocity parameters that correlate the residual internal carotid diameter with NASCET-based stenosis levels, using the diameter of the distal internal carotid lumen as the denominator for stenosis measurement. The following velocity measurements were obtained: RIGHT ICA: Peak systolic velocity 78 cm/sec, End diastolic velocity 20 cm/sec CCA: Peak systolic velocity 88 cm/sec SYSTOLIC ICA/CCA RATIO:  0.9 ECA: Peak systolic velocity 094 cm/sec LEFT ICA: Peak systolic velocity 89 cm/sec, End diastolic velocity 27 cm/sec CCA: 709 cm/sec SYSTOLIC ICA/CCA RATIO:  0.8 ECA: 128 cm/sec RIGHT CAROTID ARTERY: Moderate multifocal atherosclerotic plaque formation, most prominent at the carotid bulb. No significant tortuosity. Normal low resistance waveforms. RIGHT VERTEBRAL ARTERY:  Antegrade flow. LEFT CAROTID ARTERY: Moderate multifocal atherosclerotic plaque formation, most prominent about the carotid bulb. No significant tortuosity. Normal low resistance waveforms. LEFT VERTEBRAL ARTERY:  Antegrade flow. Upper extremity non-invasive blood pressures: Not obtained. IMPRESSION: 1. Right carotid artery system: Less than 50% stenosis secondary to moderate multifocal atherosclerotic plaque formation. 2. Left carotid artery system: Less than 50% stenosis secondary to moderate multifocal atherosclerotic plaque formation. 3.  Vertebral artery system: Patent with antegrade flow bilaterally. Ruthann Cancer, MD [facility] Electronically Signed   By: Ruthann Cancer M.D.   On: 07/08/2022 09:53   DG Chest Port 1 View  Result Date: 07/07/2022 CLINICAL DATA:  Syncope. EXAM: PORTABLE CHEST 1 VIEW COMPARISON:  None Available. FINDINGS: Normal cardiomediastinal contours. Lung volumes are low with slight asymmetric elevation of left hemidiaphragm. There are linear opacities identified within the left lower lung which are  favored to represent areas of scarring or atelectasis. No signs of pleural effusion or interstitial edema. No airspace opacities. IMPRESSION: Low lung volumes with linear opacities within the left lower lung favored to represent scarring or atelectasis. Electronically Signed   By: Kerby Moors M.D.   On: 07/07/2022 20:23     Assessment and Plan:   Severe, symptomatic aortic stenosis presenting with syncope Type 2 diabetes Elevated troponin likely demand ischemia Acute on chronic diastolic heart failure likely secondary to his critical aortic  stenosis with elevated BNP and elevated right heart diastolic filling pressures at catheterization  We discussed the natural history of aortic stenosis today at length.  We reviewed potential treatment options including palliative medical therapy, transcatheter aortic valve replacement, and surgical aortic valve replacement.  The patient's 2D echocardiogram is reviewed and shows preserved LVEF of 60 to 65%, normal RV function with normal estimated PA pressure, critical aortic stenosis with mean gradient of 73 mmHg and calculated aortic valve area of 0.4 cm, maximum transaortic velocity of 5.7 m/s.the patient understands that he will need to undergo a gated CTA of the heart as well as a CTA of the chest, abdomen, and pelvis to better evaluate anatomic feasibility of TAVR.  Considering his advanced age and limited mobility, TAVR would be the preferred treatment option for his severe aortic stenosis as long as his anatomy is favorable.  We discussed the TAVR procedure, some of the potential risks, as well as alternatives.  He understands that he will undergo CTA studies followed by multidisciplinary review of his case and formal cardiac surgical consultation.  Considering the critical nature of his aortic stenosis and presentation with syncope, we will work him up as an inpatient and try to perform TAVR prior to discharge.  I think he would be at high risk of  life-threatening complication if treatment of his aortic stenosis is delayed.   Risk Assessment/Risk Scores:             For questions or updates, please contact [place] Please consult [email or web address] for contact info under    Signed, Sherren Mocha, MD  07/09/2022 4:28 PM

## 2022-07-09 NOTE — Progress Notes (Signed)
Patient arrived to room 6E16 in NAD, VS stable and patient free from pain. Patient oriented to room and call bell in reach.

## 2022-07-09 NOTE — Progress Notes (Signed)
Report was given to [facility].

## 2022-07-10 ENCOUNTER — Other Ambulatory Visit (HOSPITAL_COMMUNITY): Payer: Self-pay

## 2022-07-10 ENCOUNTER — Inpatient Hospital Stay (HOSPITAL_COMMUNITY): Payer: Medicare HMO

## 2022-07-10 ENCOUNTER — Encounter (HOSPITAL_COMMUNITY): Payer: Self-pay | Admitting: Cardiovascular Disease

## 2022-07-10 ENCOUNTER — Other Ambulatory Visit: Payer: Self-pay | Admitting: Cardiology

## 2022-07-10 DIAGNOSIS — I350 Nonrheumatic aortic (valve) stenosis: Secondary | ICD-10-CM | POA: Diagnosis not present

## 2022-07-10 LAB — BASIC METABOLIC PANEL
Anion gap: 6 (ref 5–15)
BUN: 14 mg/dL (ref 8–23)
CO2: 27 mmol/L (ref 22–32)
Calcium: 9.7 mg/dL (ref 8.9–10.3)
Chloride: 105 mmol/L (ref 98–111)
Creatinine, Ser: 1.66 mg/dL — ABNORMAL HIGH (ref 0.61–1.24)
GFR, Estimated: 41 mL/min — ABNORMAL LOW (ref >60)
Glucose, Bld: 128 mg/dL — ABNORMAL HIGH (ref 70–99)
Potassium: 4.1 mmol/L (ref 3.5–5.1)
Sodium: 138 mmol/L (ref 135–145)

## 2022-07-10 LAB — CBC
HCT: 36.8 % — ABNORMAL LOW (ref 39.0–52.0)
Hemoglobin: 12.1 g/dL — ABNORMAL LOW (ref 13.0–17.0)
MCH: 32.9 pg (ref 26.0–34.0)
MCHC: 32.9 g/dL (ref 30.0–36.0)
MCV: 100 fL (ref 80.0–100.0)
Platelets: 132 10*3/uL — ABNORMAL LOW (ref 150–400)
RBC: 3.68 MIL/uL — ABNORMAL LOW (ref 4.22–5.81)
RDW: 12 % (ref 11.5–15.5)
WBC: 6 10*3/uL (ref 4.0–10.5)
nRBC: 0 % (ref 0.0–0.2)

## 2022-07-10 LAB — GLUCOSE, CAPILLARY
Glucose-Capillary: 194 mg/dL — ABNORMAL HIGH (ref 70–99)
Glucose-Capillary: 139 mg/dL — ABNORMAL HIGH (ref 70–99)
Glucose-Capillary: 166 mg/dL — ABNORMAL HIGH (ref 70–99)
Glucose-Capillary: 200 mg/dL — ABNORMAL HIGH (ref 70–99)
Glucose-Capillary: 111 mg/dL — ABNORMAL HIGH (ref 70–99)
Glucose-Capillary: 143 mg/dL — ABNORMAL HIGH (ref 70–99)

## 2022-07-10 MED ORDER — SODIUM CHLORIDE 0.9 % IV SOLN: 250.0000 mL | INTRAVENOUS | Status: DC | PRN

## 2022-07-10 MED ORDER — SODIUM CHLORIDE 0.9% FLUSH: 3.0000 mL | INTRAVENOUS | Status: DC | PRN

## 2022-07-10 MED ORDER — SODIUM CHLORIDE 0.9% FLUSH
3.0000 mL | Freq: Two times a day (BID) | INTRAVENOUS | Status: DC
  Administered 2022-07-10 – 2022-07-15 (×12): 3 mL via INTRAVENOUS

## 2022-07-10 MED ORDER — HEPARIN SODIUM (PORCINE) 5000 UNIT/ML IJ SOLN
5000.0000 [IU] | Freq: Three times a day (TID) | INTRAMUSCULAR | Status: DC
  Administered 2022-07-10 – 2022-07-15 (×15): 5000 [IU] via SUBCUTANEOUS
  Filled 2022-07-10 (×15): qty 1

## 2022-07-10 MED ORDER — IOHEXOL 350 MG/ML SOLN
100.0000 mL | Freq: Once | INTRAVENOUS | Status: AC | PRN
  Administered 2022-07-10: 100 mL via INTRAVENOUS

## 2022-07-10 NOTE — Progress Notes (Addendum)
[facility]VE TEAM  Patient Name: Shirline Frees Date of Encounter: 07/10/2022  Primary Cardiologist: Dr. [facility] Problem List     Principal Problem:   Syncope due to Critical aortic valve stenosis Active Problems:   Myocardial injury   Controlled type 2 diabetes mellitus with hyperglycemia, without long-term current use of insulin (HCC)   Mixed hyperlipidemia   Obesity (BMI 30-39.9)   Stage 3a chronic kidney disease (CKD) (HCC)   Syncope   Elevated troponin   Severe aortic stenosis     Subjective   Doing well this AM with no complaints. Sitting up on the side of the bed.   Inpatient Medications    Scheduled Meds:  aspirin EC  81 mg Oral Daily   heparin injection (subcutaneous)  5,000 Units Subcutaneous Q8H   insulin aspart  0-15 Units Subcutaneous TID WC   insulin aspart  0-5 Units Subcutaneous QHS   insulin glargine-yfgn  10 Units Subcutaneous QHS   pravastatin  40 mg Oral q1800   sodium chloride flush  3 mL Intravenous Q12H   sodium chloride flush  3 mL Intravenous Q12H   Continuous Infusions:  sodium chloride     sodium chloride     PRN Meds: sodium chloride, sodium chloride, acetaminophen, diclofenac Sodium, ondansetron **OR** ondansetron (ZOFRAN) IV, sodium chloride flush, sodium chloride flush   Vital Signs    Vitals:   07/09/22 2009 07/10/22 0011 07/10/22 0610 07/10/22 0700  BP: (!) 109/55 (!) 124/55 (!) 135/58 (!) 127/52  Pulse: 63 62 63 62  Resp: '18 18 20 13  '$ Temp: 98 F (36.7 C) 97.9 F (36.6 C) 97.8 F (36.6 C) 97.9 F (36.6 C)  TempSrc: Oral Oral Oral Oral  SpO2: 97% 95% 95% 96%  Weight:   115 kg   Height:        Intake/Output Summary (Last 24 hours) at 07/10/2022 0919 Last data filed at 07/09/2022 2224 Gross per 24 hour  Intake 455.96 ml  Output --  Net 455.96 ml   Filed Weights   07/08/22 0158 07/09/22 0550 07/10/22 0610  Weight: 117.7 kg 116.1 kg 115 kg    Physical  Exam    GEN: Well nourished, well developed, in no acute distress.  Cardiac: RRR, harsh, distant systolic murmur. No clubbing, cyanosis, edema.   Respiratory:  Respirations regular and unlabored, clear to auscultation bilaterally. Skin: warm and dry, no rash. Extremitys: LLE +2 pitting edema.  Psych: AAOx3.  Normal affect.  Labs    CBC Recent Labs    07/07/22 2000 07/08/22 0207 07/09/22 0424 07/09/22 1159 07/09/22 1207 07/10/22 0222  WBC 6.5   < > 6.0  --   --  6.0  NEUTROABS 5.0  --   --   --   --   --   HGB 12.3*   < > 11.9*   < > 12.2* 12.1*  HCT 37.2*   < > 36.4*   < > 36.0* 36.8*  MCV 99.7   < > 100.0  --   --  100.0  PLT 129*   < > 127*  --   --  132*   < > = values in this interval not displayed.   Basic Metabolic Panel Recent Labs    07/07/22 2000 07/08/22 0207 07/09/22 0424 07/09/22 1159 07/09/22 1207 07/10/22 0222  NA 136 136 138   < > 140 138  K 4.2 4.1 4.1   < > 4.2  4.1  CL 102 105 104  --   --  105  CO2 '27 27 28  '$ --   --  27  GLUCOSE 247* 220* 132*  --   --  128*  BUN '17 15 15  '$ --   --  14  CREATININE 1.52* 1.39* 1.47*  --   --  1.66*  CALCIUM 9.5 9.4 9.6  --   --  9.7  MG 2.0 1.9  --   --   --   --   PHOS  --  2.6  --   --   --   --    < > = values in this interval not displayed.   Liver Function Tests Recent Labs    07/07/22 2000 07/08/22 0207  AST 23 21  ALT 17 17  ALKPHOS 82 76  BILITOT 0.8 0.9  PROT 7.2 7.0  ALBUMIN 3.9 3.9   No results for input(s): "LIPASE", "AMYLASE" in the last 72 hours. Cardiac Enzymes No results for input(s): "CKTOTAL", "CKMB", "CKMBINDEX", "TROPONINI" in the last 72 hours. BNP Invalid input(s): "POCBNP" D-Dimer Recent Labs    07/08/22 0650  DDIMER 0.52*   Hemoglobin A1C Recent Labs    07/08/22 0207  HGBA1C 6.4*   Fasting Lipid Panel Recent Labs    07/09/22 0424  CHOL 135  HDL 45  LDLCALC 80  TRIG 48  CHOLHDL 3.0    Telemetry    NSR, HR 60s - Personally Reviewed  ECG    No new ECG -  Personally Reviewed  Radiology    CARDIAC CATHETERIZATION  Result Date: 07/09/2022   Dist Cx lesion is 10% stenosed.   Mid LAD lesion is 10% stenosed. No significant coronary obstructive disease with large relatively normal LAD, ramus intermediate, left circumflex, and RCA. Severely calcified aortic valve with significant restriction of motion. No attempt to cross the aortic valve with echo Doppler data from yesterday showing critical AS with a mean gradient of 73 mmHg, peak to peak gradient of 107 mmHg, and aortic valve area estimate of 0.4 to 0.5 cm. RECOMMENDATION: Patient will be admitted.  Surgical consultation for aortic valve replacement.  Will need scheduling with CT imaging per structural team.   ECHOCARDIOGRAM COMPLETE  Result Date: 07/08/2022    ECHOCARDIOGRAM REPORT   Patient Name:   SHELBY ANDERLE Date of Exam: 07/08/2022 Medical Rec #:  [id number]       Height:       69.0 in Accession #:    [id number]      Weight:       259.5 lb Date of Birth:  1941/04/19       BSA:          2.308 m Patient Age:    81 years        BP:           124/55 mmHg Patient Gender: M               HR:           71 bpm. Exam Location:  Inpatient Procedure: 2D Echo, 3D Echo, Cardiac Doppler and Color Doppler Indications:    Syncope R55  History:        Patient has no prior history of Echocardiogram examinations.                 Signs/Symptoms:Syncope; Risk Factors:Former Smoker, Diabetes and                 Dyslipidemia.  Sonographer:    Greer Pickerel Referring Phys: [id number] OLADAPO ADEFESO  Sonographer Comments: Patient is obese. Image acquisition challenging due to respiratory motion. IMPRESSIONS  1. Left ventricular ejection fraction, by estimation, is 60 to 65%. The left ventricle has normal function. The left ventricle has no regional wall motion abnormalities. There is severe left ventricular hypertrophy. Left ventricular diastolic parameters  are indeterminate.  2. Right ventricular systolic function is normal. The  right ventricular size is normal. There is normal pulmonary artery systolic pressure.  3. There is severe calcifcation of the aortic valve. There is evidence of critical aortic valve stenosis with Vmax 5.7 m/sec, mean pressure gradient 73 mm Hg and aortic valve area of 0.4 cm2 and trivial aortic valve regurgitation.  4. The mitral valve is grossly normal. Trivial mitral valve regurgitation. No evidence of mitral stenosis.  5. The inferior vena cava is dilated in size with <50% respiratory variability, suggesting right atrial pressure of 15 mmHg. FINDINGS  Left Ventricle: Left ventricular ejection fraction, by estimation, is 60 to 65%. The left ventricle has normal function. The left ventricle has no regional wall motion abnormalities. The left ventricular internal cavity size was normal in size. There is  severe left ventricular hypertrophy. Left ventricular diastolic parameters are indeterminate. Right Ventricle: The right ventricular size is normal. No increase in right ventricular wall thickness. Right ventricular systolic function is normal. There is normal pulmonary artery systolic pressure. The tricuspid regurgitant velocity is 1.97 m/s, and  with an assumed right atrial pressure of 8 mmHg, the estimated right ventricular systolic pressure is 94.1 mmHg. Left Atrium: Left atrial size was normal in size. Right Atrium: Right atrial size was normal in size. Pericardium: There is no evidence of pericardial effusion. Mitral Valve: The mitral valve is grossly normal. Trivial mitral valve regurgitation. No evidence of mitral valve stenosis. Tricuspid Valve: The tricuspid valve is not well visualized. Tricuspid valve regurgitation is not demonstrated. No evidence of tricuspid stenosis. Aortic Valve: There is severe calcifcation of the aortic valve. There is severe aortic valve annular calcification. Aortic valve regurgitation is trivial. Severe aortic stenosis is present. Aortic valve mean gradient measures 59.7 mmHg.  Aortic valve peak  gradient measures 107.1 mmHg. Aortic valve area, by VTI measures 0.48 cm. Pulmonic Valve: The pulmonic valve was not well visualized. Pulmonic valve regurgitation is not visualized. No evidence of pulmonic stenosis. Aorta: The aortic root is normal in size and structure. Venous: The inferior vena cava is dilated in size with less than 50% respiratory variability, suggesting right atrial pressure of 15 mmHg. IAS/Shunts: No atrial level shunt detected by color flow Doppler.  LEFT VENTRICLE PLAX 2D LVIDd:         4.60 cm   Diastology LVIDs:         3.20 cm   LV e' medial:    4.46 cm/s LV PW:         1.20 cm   LV E/e' medial:  20.9 LV IVS:        0.90 cm   LV e' lateral:   5.22 cm/s LVOT diam:     2.00 cm   LV E/e' lateral: 17.9 LV SV:         60 LV SV Index:   26 LVOT Area:     3.14 cm                           3D Volume EF:  3D EF:        57 %                          LV EDV:       242 ml                          LV ESV:       105 ml                          LV SV:        137 ml RIGHT VENTRICLE RV S prime:     12.30 cm/s TAPSE (M-mode): 2.3 cm LEFT ATRIUM           Index        RIGHT ATRIUM           Index LA diam:      4.50 cm 1.95 cm/m   RA Area:     11.30 cm LA Vol (A2C): 68.4 ml 29.64 ml/m  RA Volume:   22.70 ml  9.84 ml/m LA Vol (A4C): 73.5 ml 31.85 ml/m  AORTIC VALVE AV Area (Vmax):    0.56 cm AV Area (Vmean):   0.54 cm AV Area (VTI):     0.48 cm AV Vmax:           517.33 cm/s AV Vmean:          357.667 cm/s AV VTI:            1.240 m AV Peak Grad:      107.1 mmHg AV Mean Grad:      59.7 mmHg LVOT Vmax:         93.00 cm/s LVOT Vmean:        61.300 cm/s LVOT VTI:          0.191 m LVOT/AV VTI ratio: 0.15  AORTA Ao Root diam: 3.60 cm Ao Asc diam:  3.80 cm MITRAL VALVE                TRICUSPID VALVE MV Area (PHT): 3.03 cm     TR Peak grad:   15.5 mmHg MV Decel Time: 250 msec     TR Vmax:        197.00 cm/s MV E velocity: 93.40 cm/s MV A velocity: 111.00 cm/s   SHUNTS MV E/A ratio:  0.84         Systemic VTI:  0.19 m                             Systemic Diam: 2.00 cm Vishnu Priya Mallipeddi Electronically signed by Lorelee Cover Mallipeddi Signature Date/Time: 07/08/2022/12:25:11 PM    Final    US Carotid Bilateral  Result Date: 07/08/2022 CLINICAL DATA:  81 year old male with history of syncope. EXAM: BILATERAL CAROTID DUPLEX ULTRASOUND TECHNIQUE: Pearline Cables scale imaging, color Doppler and duplex ultrasound were performed of bilateral carotid and vertebral arteries in the neck. COMPARISON:  None Available. FINDINGS: Criteria: Quantification of carotid stenosis is based on velocity parameters that correlate the residual internal carotid diameter with NASCET-based stenosis levels, using the diameter of the distal internal carotid lumen as the denominator for stenosis measurement. The following velocity measurements were obtained: RIGHT ICA: Peak systolic velocity 78 cm/sec, End diastolic velocity 20 cm/sec CCA: Peak systolic velocity 88 cm/sec SYSTOLIC ICA/CCA RATIO:  0.9 ECA: Peak systolic velocity 878 cm/sec LEFT ICA: Peak systolic velocity 89 cm/sec, End diastolic velocity 27 cm/sec CCA: 676 cm/sec SYSTOLIC ICA/CCA RATIO:  0.8 ECA: 128 cm/sec RIGHT CAROTID ARTERY: Moderate multifocal atherosclerotic plaque formation, most prominent at the carotid bulb. No significant tortuosity. Normal low resistance waveforms. RIGHT VERTEBRAL ARTERY:  Antegrade flow. LEFT CAROTID ARTERY: Moderate multifocal atherosclerotic plaque formation, most prominent about the carotid bulb. No significant tortuosity. Normal low resistance waveforms. LEFT VERTEBRAL ARTERY:  Antegrade flow. Upper extremity non-invasive blood pressures: Not obtained. IMPRESSION: 1. Right carotid artery system: Less than 50% stenosis secondary to moderate multifocal atherosclerotic plaque formation. 2. Left carotid artery system: Less than 50% stenosis secondary to moderate multifocal atherosclerotic plaque formation. 3.   Vertebral artery system: Patent with antegrade flow bilaterally. Ruthann Cancer, MD [facility] Electronically Signed   By: Ruthann Cancer M.D.   On: 07/08/2022 09:53    Cardiac Studies   2D echocardiogram:  1. Left ventricular ejection fraction, by estimation, is 60 to 65%. The  left ventricle has normal function. The left ventricle has no regional  wall motion abnormalities. There is severe left ventricular hypertrophy.  Left ventricular diastolic parameters   are indeterminate.   2. Right ventricular systolic function is normal. The right ventricular  size is normal. There is normal pulmonary artery systolic pressure.   3. There is severe calcifcation of the aortic valve. There is evidence of  critical aortic valve stenosis with Vmax 5.7 m/sec, mean pressure gradient  73 mm Hg and aortic valve area of 0.4 cm2 and trivial aortic valve  regurgitation.   4. The mitral valve is grossly normal. Trivial mitral valve  regurgitation. No evidence of mitral stenosis.   5. The inferior vena cava is dilated in size with <50% respiratory  variability, suggesting right atrial pressure of 15 mmHg.     Patient Profile     Mr. Busler is an 81 year old African American male with a history of presumed chronic kidney diseae, DM2, and obesity who presented to the [facility] 07/07/22 after multiple syncopal episodes over several months. After finding critical AS on echo, he was then transferred to [facility] for structural heart evaluation.   Assessment & Plan    Critical AS with syncope: Patient is being evaluated for TAVR. L/RHC notable for mild non-obstructive disease. Pre TAVR CT imaging scheduled for today. No syncopal episodes since admission. Plan for TAVR before discharge.   Kidney disease: Cr 1.66 today after IV contrast for cath yesterday. Will continue to watch closely.   DM2: sliding scale while inpatient.   Chronic diastolic HF: likely secondary  to critical AS with elevated BNP on admission. Appears euvolemic.   Signed, Hessie Dibble, RN, Nurse Practitioner Student 07/10/2022, 9:19 AM   Patient seen, examined. Available data reviewed. Agree with findings, assessment, and plan as outlined by Ellsworth Lennox, RN, NP Student.  The patient is independently interviewed and examined.  His wife is at the bedside.  He is alert, oriented, obese male in no distress.  HEENT normal, lungs clear bilaterally, heart regular rate and rhythm with 3/6 crescendo decrescendo murmur at the right upper sternal border, abdomen soft and nontender, extremities with trace edema.  Plans as outlined per yesterday's consult note.  The patient to go for CTA studies of the chest, abdomen, and pelvis today.  At the same time he will undergo a gated cardiac CTA for TAVR planning.  We will request cardiac surgical  consultation in this patient with critical aortic stenosis and syncope.  He will remain hospitalized for the time being and pending further evaluation we will try to expedite TAVR to be performed at the next available time.  Sherren Mocha, M.D. 07/10/2022 12:05 PM

## 2022-07-10 NOTE — Plan of Care (Signed)
Problem: Education: Goal: Knowledge of General Education information will improve Description: Including pain rating scale, medication(s)/side effects and non-pharmacologic comfort measures Outcome: Progressing   Problem: Health Behavior/Discharge Planning: Goal: Ability to manage health-related needs will improve Outcome: Progressing   Problem: Clinical Measurements: Goal: Ability to maintain clinical measurements within normal limits will improve Outcome: Progressing Goal: Will remain free from infection Outcome: Progressing Goal: Diagnostic test results will improve Outcome: Progressing Goal: Respiratory complications will improve Outcome: Progressing Goal: Cardiovascular complication will be avoided Outcome: Progressing   Problem: Activity: Goal: Risk for activity intolerance will decrease Outcome: Progressing   Problem: Nutrition: Goal: Adequate nutrition will be maintained Outcome: Progressing   Problem: Coping: Goal: Level of anxiety will decrease Outcome: Progressing   Problem: Elimination: Goal: Will not experience complications related to bowel motility Outcome: Progressing Goal: Will not experience complications related to urinary retention Outcome: Progressing   Problem: Pain Managment: Goal: General experience of comfort will improve Outcome: Progressing   Problem: Safety: Goal: Ability to remain free from injury will improve Outcome: Progressing   Problem: Skin Integrity: Goal: Risk for impaired skin integrity will decrease Outcome: Progressing   Problem: Education: Goal: Ability to describe self-care measures that may prevent or decrease complications (Diabetes Survival Skills Education) will improve Outcome: Progressing Goal: Individualized Educational Video(s) Outcome: Progressing   Problem: Coping: Goal: Ability to adjust to condition or change in health will improve Outcome: Progressing   Problem: Fluid Volume: Goal: Ability to  maintain a balanced intake and output will improve Outcome: Progressing   Problem: Health Behavior/Discharge Planning: Goal: Ability to identify and utilize available resources and services will improve Outcome: Progressing Goal: Ability to manage health-related needs will improve Outcome: Progressing   Problem: Metabolic: Goal: Ability to maintain appropriate glucose levels will improve Outcome: Progressing   Problem: Nutritional: Goal: Maintenance of adequate nutrition will improve Outcome: Progressing Goal: Progress toward achieving an optimal weight will improve Outcome: Progressing   Problem: Skin Integrity: Goal: Risk for impaired skin integrity will decrease Outcome: Progressing   Problem: Tissue Perfusion: Goal: Adequacy of tissue perfusion will improve Outcome: Progressing   Problem: Education: Goal: Knowledge of General Education information will improve Description: Including pain rating scale, medication(s)/side effects and non-pharmacologic comfort measures Outcome: Progressing   Problem: Health Behavior/Discharge Planning: Goal: Ability to manage health-related needs will improve Outcome: Progressing   Problem: Clinical Measurements: Goal: Ability to maintain clinical measurements within normal limits will improve Outcome: Progressing Goal: Will remain free from infection Outcome: Progressing Goal: Diagnostic test results will improve Outcome: Progressing Goal: Respiratory complications will improve Outcome: Progressing Goal: Cardiovascular complication will be avoided Outcome: Progressing   Problem: Activity: Goal: Risk for activity intolerance will decrease Outcome: Progressing   Problem: Nutrition: Goal: Adequate nutrition will be maintained Outcome: Progressing   Problem: Coping: Goal: Level of anxiety will decrease Outcome: Progressing   Problem: Elimination: Goal: Will not experience complications related to bowel motility Outcome:  Progressing Goal: Will not experience complications related to urinary retention Outcome: Progressing   Problem: Pain Managment: Goal: General experience of comfort will improve Outcome: Progressing   Problem: Safety: Goal: Ability to remain free from injury will improve Outcome: Progressing   Problem: Skin Integrity: Goal: Risk for impaired skin integrity will decrease Outcome: Progressing   Problem: Education: Goal: Understanding of CV disease, CV risk reduction, and recovery process will improve Outcome: Progressing Goal: Individualized Educational Video(s) Outcome: Progressing   Problem: Activity: Goal: Ability to return to baseline activity level will improve Outcome: Progressing  Problem: Cardiovascular: Goal: Ability to achieve and maintain adequate cardiovascular perfusion will improve Outcome: Progressing Goal: Vascular access site(s) Level 0-1 will be maintained Outcome: Progressing   

## 2022-07-11 DIAGNOSIS — I350 Nonrheumatic aortic (valve) stenosis: Secondary | ICD-10-CM

## 2022-07-11 LAB — BASIC METABOLIC PANEL
Anion gap: 8 (ref 5–15)
BUN: 15 mg/dL (ref 8–23)
CO2: 26 mmol/L (ref 22–32)
Calcium: 9.7 mg/dL (ref 8.9–10.3)
Chloride: 100 mmol/L (ref 98–111)
Creatinine, Ser: 1.65 mg/dL — ABNORMAL HIGH (ref 0.61–1.24)
GFR, Estimated: 41 mL/min — ABNORMAL LOW (ref >60)
Glucose, Bld: 134 mg/dL — ABNORMAL HIGH (ref 70–99)
Potassium: 4 mmol/L (ref 3.5–5.1)
Sodium: 134 mmol/L — ABNORMAL LOW (ref 135–145)

## 2022-07-11 LAB — GLUCOSE, CAPILLARY
Glucose-Capillary: 146 mg/dL — ABNORMAL HIGH (ref 70–99)
Glucose-Capillary: 170 mg/dL — ABNORMAL HIGH (ref 70–99)
Glucose-Capillary: 141 mg/dL — ABNORMAL HIGH (ref 70–99)
Glucose-Capillary: 149 mg/dL — ABNORMAL HIGH (ref 70–99)

## 2022-07-11 LAB — LIPOPROTEIN A (LPA): Lipoprotein (a): 185.5 nmol/L — ABNORMAL HIGH (ref <75.0)

## 2022-07-11 NOTE — Progress Notes (Addendum)
[place] VALVE TEAM  Patient Name: Gerald Crosby Date of Encounter: 07/11/2022  Primary Cardiologist: [facility]. [facility] Problem List     Principal Problem:   Syncope due to Critical aortic valve stenosis Active Problems:   Myocardial injury   Controlled type 2 diabetes mellitus with hyperglycemia, without long-term current use of insulin (HCC)   Mixed hyperlipidemia   Obesity (BMI 30-39.9)   Stage 3a chronic kidney disease (CKD) (HCC)   Syncope   Elevated troponin   Severe aortic stenosis   Subjective   Doing well this AM with no complaints. Sitting up on the side of the bed. States he as already walked with no SOB.   Inpatient Medications    Scheduled Meds:  aspirin EC  81 mg Oral Daily   heparin injection (subcutaneous)  5,000 Units Subcutaneous Q8H   insulin aspart  0-15 Units Subcutaneous TID WC   insulin aspart  0-5 Units Subcutaneous QHS   insulin glargine-yfgn  10 Units Subcutaneous QHS   pravastatin  40 mg Oral q1800   sodium chloride flush  3 mL Intravenous Q12H   sodium chloride flush  3 mL Intravenous Q12H   Continuous Infusions:  sodium chloride     sodium chloride     PRN Meds: sodium chloride, sodium chloride, acetaminophen, diclofenac Sodium, ondansetron **OR** ondansetron (ZOFRAN) IV, sodium chloride flush, sodium chloride flush   Vital Signs    Vitals:   07/10/22 1400 07/10/22 1639 07/10/22 2000 07/11/22 0412  BP: (!) 114/49 (!) 137/59 (!) 136/49 (!) 125/54  Pulse: 67 (!) 59 62 71  Resp: '12 16 18   '$ Temp: 97.8 F (36.6 C) 98.2 F (36.8 C) 97.9 F (36.6 C) 97.7 F (36.5 C)  TempSrc: Oral Oral Oral Oral  SpO2: 96% 96% 94% 96%  Weight:    112.6 kg  Height:        Intake/Output Summary (Last 24 hours) at 07/11/2022 0941 Last data filed at 07/10/2022 2250 Gross per 24 hour  Intake 769 ml  Output --  Net 769 ml   Filed Weights   07/09/22 0550 07/10/22 0610 07/11/22 0412  Weight:  116.1 kg 115 kg 112.6 kg   Physical Exam   GEN: Well nourished, well developed, in no acute distress.  Cardiac: RRR, harsh, distant systolic murmur. No clubbing, cyanosis, edema.   Respiratory:  Respirations regular and unlabored, clear to auscultation bilaterally. Skin: warm and dry, no rash. Extremitys: LLE +2 pitting edema.  Psych: AAOx3.  Normal affect.  Labs    CBC Recent Labs    07/09/22 0424 07/09/22 1159 07/09/22 1207 07/10/22 0222  WBC 6.0  --   --  6.0  HGB 11.9*   < > 12.2* 12.1*  HCT 36.4*   < > 36.0* 36.8*  MCV 100.0  --   --  100.0  PLT 127*  --   --  132*   < > = values in this interval not displayed.   Basic Metabolic Panel Recent Labs    07/10/22 0222 07/11/22 0145  NA 138 134*  K 4.1 4.0  CL 105 100  CO2 27 26  GLUCOSE 128* 134*  BUN 14 15  CREATININE 1.66* 1.65*  CALCIUM 9.7 9.7    Fasting Lipid Panel Recent Labs    07/09/22 0424  CHOL 135  HDL 45  LDLCALC 80  TRIG 48  CHOLHDL 3.0    Telemetry    NSR HR 60s -  Personally Reviewed  ECG    No new ECG - Personally Reviewed  Radiology    CT CORONARY MORPH W/CTA COR W/SCORE W/CA W/CM &/OR WO/CM  Addendum Date: 07/11/2022   ADDENDUM REPORT: 07/11/2022 08:37 EXAM: OVER-READ INTERPRETATION  CT CHEST The following report is an over-read performed by radiologist Dr. Maudry Diego [facility] on 07/11/2022. This over-read does not include interpretation of cardiac or coronary anatomy or pathology. The cardiac TAVR interpretation by the cardiologist is attached. COMPARISON:  None. FINDINGS: Extracardiac findings will be described separately under dictation for contemporaneously obtained CTA chest, abdomen and pelvis. IMPRESSION: Please see separate dictation for contemporaneously obtained CTA chest, abdomen and pelvis dated 07/11/2022 for full description of relevant extracardiac findings. Electronically Signed   By: Yetta Glassman M.D.   On: 07/11/2022 08:37   Result Date:  07/11/2022 CLINICAL DATA:  Aortic Stenosis EXAM: Cardiac TAVR CT TECHNIQUE: The patient was scanned on a Siemens Force 937 slice scanner. A 120 kV retrospective scan was triggered in the ascending thoracic aorta at 140 HU's. Gantry rotation speed was 250 msecs and collimation was .6 mm. No beta blockade or nitro were given. The 3D data set was reconstructed in 5% intervals of the R-R cycle. Systolic and diastolic phases were analyzed on a dedicated work station using MPR, MIP and VRT modes. The patient received 80 cc of contrast. FINDINGS: Aortic Valve: Tri leaflet calcified with score  3631 Aorta: Normal arch vessels no aneurysm moderate calcific atherosclerosis Sino-tubular Junction: 27.4 mm Ascending Thoracic Aorta: 33 mm Aortic Arch: 24 mm Descending Thoracic Aorta: 24 mm Sinus of Valsalva Measurements: Non-coronary: 30.7 mm Right - coronary: 28 mm Left -   coronary: 30.7 mm Coronary Artery Height above Annulus: Left Main: 10 mm above annulus Right Coronary: 12.1 mm above annulus Virtual Basal Annulus Measurements: Maximum / Minimum Diameter: 23.7 mm x 22 mm Perimeter: 79.8 mm Area: 459 mm2 Coronary Arteries: Sufficient height above annulus for deployment LM a bit shallow Optimum Fluoroscopic Angle for Delivery: LAO 18 Caudal 11 degrees IMPRESSION: 1. Calcified tri leaflet AV with score 3631 2.  Annular area of 459 mm2 suitable for a 26 mm Sapien 3 valve 3. Optimum angiographic angle for deployment LAO 18 Caudal 11 degrees 4.  Coronary arteries sufficient height for deployment LM - 10 mm 5.  Membranous septal length 7.4 mm Gerald Crosby Electronically Signed: By: Gerald Crosby M.D. On: 07/10/2022 16:39   CT ANGIO ABDOMEN PELVIS  W &/OR WO CONTRAST  Result Date: 07/11/2022 CLINICAL DATA:  Preop evaluation for aortic valve replacement EXAM: CT ANGIOGRAPHY CHEST, ABDOMEN AND PELVIS TECHNIQUE: Non-contrast CT of the chest was initially obtained. Multidetector CT imaging through the chest, abdomen and pelvis was  performed using the standard protocol during bolus administration of intravenous contrast. Multiplanar reconstructed images and MIPs were obtained and reviewed to evaluate the vascular anatomy. RADIATION DOSE REDUCTION: This exam was performed according to the departmental dose-optimization program which includes automated exposure control, adjustment of the mA and/or kV according to patient size and/or use of iterative reconstruction technique. CONTRAST:  137m OMNIPAQUE IOHEXOL 350 MG/ML SOLN COMPARISON:  None Available. FINDINGS: CTA CHEST FINDINGS Cardiovascular: Cardiomegaly. Aortic valve thickening and calcifications. Normal caliber thoracic aorta with moderate atherosclerotic disease. Standard three-vessel aortic arch with no significant stenosis. Left main lad and RCA coronary artery calcifications. Mediastinum/Nodes: Esophagus and thyroid are unremarkable. No pathologically enlarged lymph nodes seen in the chest. Lungs/Pleura: Central airways are patent. Bibasilar atelectasis. No consolidation, pleural effusion or  pneumothorax. Musculoskeletal: No chest wall abnormality. No acute or significant osseous findings. CTA ABDOMEN AND PELVIS FINDINGS Hepatobiliary: No focal liver abnormality is seen. No gallstones, gallbladder wall thickening, or biliary dilatation. Pancreas: Unremarkable. No pancreatic ductal dilatation or surrounding inflammatory changes. Spleen: Normal in size without focal abnormality. Adrenals/Urinary Tract: Bilateral adrenal glands are unremarkable. No hydronephrosis or nephrolithiasis. Bilateral low-attenuation renal lesions, largest are compatible with simple cysts, others are too small to completely characterize, no specific follow-up imaging is recommended. Bladder is unremarkable. Stomach/Bowel: Stomach is within normal limits. Appendix appears normal. No evidence of bowel wall thickening, distention, or inflammatory changes. Vascular/lymphatic: Normal caliber abdominal aorta with  moderate atherosclerotic disease. No significant stenosis. No pathologically enlarged lymph nodes in the abdomen or pelvis. Reproductive: Mild median lobe hypertrophy. Other: No abdominal wall hernia or abnormality. Trace abdominal ascites. Musculoskeletal: No acute or significant osseous findings. VASCULAR MEASUREMENTS PERTINENT TO TAVR: AORTA: Minimal Aortic Diameter-13.3 mm Severity of Aortic Calcification-moderate RIGHT PELVIS: Right Common Iliac Artery - Minimal Diameter-6.9 mm Tortuosity-mild Calcification-moderate Right External Iliac Artery - Minimal Diameter-7.7 mm Tortuosity-moderate Calcification-mild Right Common Femoral Artery - Minimal Diameter-6.0 mm Tortuosity-mild Calcification-mild LEFT PELVIS: Left Common Iliac Artery - Minimal Diameter-6.9 mm Tortuosity-none Calcification-moderate Left External Iliac Artery - Minimal Diameter-8.3 mm Tortuosity-moderate Calcification-none Left Common Femoral Artery - Minimal Diameter-7.7 mm Tortuosity-mild Calcification-mild Review of the MIP images confirms the above findings. IMPRESSION: 1. Vascular findings and measurements pertinent to potential TAVR procedure, as detailed above. 2. Thickening and calcification of the aortic valve, compatible with reported clinical history of aortic stenosis. 3. Moderate aortoiliac atherosclerosis. Left main lad and RCA coronary artery calcifications. Electronically Signed   By: Yetta Glassman M.D.   On: 07/11/2022 08:36   CT ANGIO CHEST AORTA W/CM & OR WO/CM  Result Date: 07/11/2022 CLINICAL DATA:  Preop evaluation for aortic valve replacement EXAM: CT ANGIOGRAPHY CHEST, ABDOMEN AND PELVIS TECHNIQUE: Non-contrast CT of the chest was initially obtained. Multidetector CT imaging through the chest, abdomen and pelvis was performed using the standard protocol during bolus administration of intravenous contrast. Multiplanar reconstructed images and MIPs were obtained and reviewed to evaluate the vascular anatomy. RADIATION  DOSE REDUCTION: This exam was performed according to the departmental dose-optimization program which includes automated exposure control, adjustment of the mA and/or kV according to patient size and/or use of iterative reconstruction technique. CONTRAST:  184m OMNIPAQUE IOHEXOL 350 MG/ML SOLN COMPARISON:  None Available. FINDINGS: CTA CHEST FINDINGS Cardiovascular: Cardiomegaly. Aortic valve thickening and calcifications. Normal caliber thoracic aorta with moderate atherosclerotic disease. Standard three-vessel aortic arch with no significant stenosis. Left main lad and RCA coronary artery calcifications. Mediastinum/Nodes: Esophagus and thyroid are unremarkable. No pathologically enlarged lymph nodes seen in the chest. Lungs/Pleura: Central airways are patent. Bibasilar atelectasis. No consolidation, pleural effusion or pneumothorax. Musculoskeletal: No chest wall abnormality. No acute or significant osseous findings. CTA ABDOMEN AND PELVIS FINDINGS Hepatobiliary: No focal liver abnormality is seen. No gallstones, gallbladder wall thickening, or biliary dilatation. Pancreas: Unremarkable. No pancreatic ductal dilatation or surrounding inflammatory changes. Spleen: Normal in size without focal abnormality. Adrenals/Urinary Tract: Bilateral adrenal glands are unremarkable. No hydronephrosis or nephrolithiasis. Bilateral low-attenuation renal lesions, largest are compatible with simple cysts, others are too small to completely characterize, no specific follow-up imaging is recommended. Bladder is unremarkable. Stomach/Bowel: Stomach is within normal limits. Appendix appears normal. No evidence of bowel wall thickening, distention, or inflammatory changes. Vascular/lymphatic: Normal caliber abdominal aorta with moderate atherosclerotic disease. No significant stenosis. No pathologically enlarged lymph nodes in the  abdomen or pelvis. Reproductive: Mild median lobe hypertrophy. Other: No abdominal wall hernia or  abnormality. Trace abdominal ascites. Musculoskeletal: No acute or significant osseous findings. VASCULAR MEASUREMENTS PERTINENT TO TAVR: AORTA: Minimal Aortic Diameter-13.3 mm Severity of Aortic Calcification-moderate RIGHT PELVIS: Right Common Iliac Artery - Minimal Diameter-6.9 mm Tortuosity-mild Calcification-moderate Right External Iliac Artery - Minimal Diameter-7.7 mm Tortuosity-moderate Calcification-mild Right Common Femoral Artery - Minimal Diameter-6.0 mm Tortuosity-mild Calcification-mild LEFT PELVIS: Left Common Iliac Artery - Minimal Diameter-6.9 mm Tortuosity-none Calcification-moderate Left External Iliac Artery - Minimal Diameter-8.3 mm Tortuosity-moderate Calcification-none Left Common Femoral Artery - Minimal Diameter-7.7 mm Tortuosity-mild Calcification-mild Review of the MIP images confirms the above findings. IMPRESSION: 1. Vascular findings and measurements pertinent to potential TAVR procedure, as detailed above. 2. Thickening and calcification of the aortic valve, compatible with reported clinical history of aortic stenosis. 3. Moderate aortoiliac atherosclerosis. Left main lad and RCA coronary artery calcifications. Electronically Signed   By: Yetta Glassman M.D.   On: 07/11/2022 08:36   CARDIAC CATHETERIZATION  Result Date: 07/09/2022   Dist Cx lesion is 10% stenosed.   Mid LAD lesion is 10% stenosed. No significant coronary obstructive disease with large relatively normal LAD, ramus intermediate, left circumflex, and RCA. Severely calcified aortic valve with significant restriction of motion. No attempt to cross the aortic valve with echo Doppler data from yesterday showing critical AS with a mean gradient of 73 mmHg, peak to peak gradient of 107 mmHg, and aortic valve area estimate of 0.4 to 0.5 cm. RECOMMENDATION: Patient will be admitted.  Surgical consultation for aortic valve replacement.  Will need scheduling with CT imaging per structural team.    Cardiac Studies   2D  echocardiogram:  1. Left ventricular ejection fraction, by estimation, is 60 to 65%. The  left ventricle has normal function. The left ventricle has no regional  wall motion abnormalities. There is severe left ventricular hypertrophy.  Left ventricular diastolic parameters   are indeterminate.   2. Right ventricular systolic function is normal. The right ventricular  size is normal. There is normal pulmonary artery systolic pressure.   3. There is severe calcifcation of the aortic valve. There is evidence of  critical aortic valve stenosis with Vmax 5.7 m/sec, mean pressure gradient  73 mm Hg and aortic valve area of 0.4 cm2 and trivial aortic valve  regurgitation.   4. The mitral valve is grossly normal. Trivial mitral valve  regurgitation. No evidence of mitral stenosis.   5. The inferior vena cava is dilated in size with <50% respiratory  variability, suggesting right atrial pressure of 15 mmHg.    Patient Profile     Gerald Crosby is an 81 year old African American male with a history of presumed chronic kidney diseae, DM2, and obesity who presented to [facility] 07/07/22 after multiple syncopal episodes over several months. After finding critical AS on echo, he was then transferred to [facility] for structural heart evaluation.    Assessment & Plan    Critical AS with syncope: Patient is being evaluated for TAVR after presented with multiple syncopal episodes. Echocardiogram showed preserved LV function at 60-65% with evidence of critical aortic stenosis with an AVA by VTI at 0.4cm2, mean gradient at 59.63mHg, peak at 107.123mg, DI at 0.15, and SVI at 26. Subsequent L/RHC notable for mild non-obstructive disease. Pre TAVR CT imaging with anatomy suitable for TAVR with a 2663m3UR valve. No syncopal episodes since admission. TCTS consulted for surgical evaluation. Plan to keep inpatient and for TAVR  on [place], 11/14.    Kidney disease: Cr 1.66 >> 1.65. Stable with no changes needed today.    DM2:  Sliding scale while inpatient. Plan to resume home regimen at d/c.    Chronic diastolic HF: Likely secondary to critical AS with elevated BNP on admission. Appears euvolemic today.   Signed, Hessie Dibble, RN, Nurse Practitioner Student.   07/11/2022, 9:41 AM   Patient seen, examined. Available data reviewed. Agree with findings, assessment, and plan as outlined by Ellsworth Lennox, RN, NP-student.  The patient is independently interviewed and examined.  He is resting comfortably in bed.  He is alert, oriented, no distress.  Lungs are clear bilaterally, heart is regular rate and rhythm with a 3/6 harsh systolic murmur at the right upper sternal border with no diastolic murmur, abdomen is soft, obese, nontender, extremities have trace pretibial edema.  CTA studies demonstrate cardiac and vascular anatomy that appears suitable for transfemoral TAVR.  Because of the patient's critical aortic stenosis and syncope at presentation, as well as the fact that he lives with his elderly wife in [place] not near any medical facility, I think it is safest to keep him in the hospital with plans for TAVR on Tuesday.  The patient understands.  He is awaiting formal cardiac surgical consultation as part of a multidisciplinary approach to his care.  Otherwise he appears clinically stable at this point.  Sherren Mocha, M.D. 07/11/2022 1:04 PM

## 2022-07-11 NOTE — Evaluation (Signed)
Physical Therapy Evaluation Patient Details Name: Gerald Crosby MRN: [id number] DOB: [birth year] Today's Date: 07/11/2022  History of Present Illness  Pt is an 81 y.o. male who presented 07/07/22 with multiple syncopal episode. 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 S/p cardiac cath 11/8. Plan for TAVR. PMH: CKD, DM2, obesity, cancer, arthritis, HLD    Clinical Impression  Pt presents with condition above and deficits mentioned below, see PT Problem List. PTA, he was independent, only intermittently using a SPC in the community. He lives with his wife in a 1-level house with 1 short STE. Currently, pt demonstrates deficits in balance and mobility primarily due to chronic R hip and knee and L ankle pain/stiffness. He is capable of mobilizing without UE support, assistance, or LOB, but does display some mild balance deficits, which appear to improve as the distance he ambulates progresses and thus as he warms up his body. See below for BP measures and 5 MW Test results. Will continue to follow acutely and further assess pt's d/c needs once pt is s/p TAVR.   5MW Test average from 3 trials = 1.61 ft/sec   BP: 132/79 sitting 119/96 standing 120/88 standing ~3 min 128/66 sitting after ambulating *pt denied any symptoms today         Recommendations for follow up therapy are one component of a multi-disciplinary discharge planning process, led by the attending physician.  Recommendations may be updated based on patient status, additional functional criteria and insurance authorization.  Follow Up Recommendations Outpatient PT (pending assessment s/p TAVR)      Assistance Recommended at Discharge PRN  Patient can return home with the following       Equipment Recommendations None recommended by PT (pending assessment s/p TAVR)  Recommendations for Other Services       Functional Status Assessment Patient has had a recent decline in their functional status and demonstrates the ability to  make significant improvements in function in a reasonable and predictable amount of time.     Precautions / Restrictions Precautions Precautions: Other (comment);Fall Precaution Comments: watch BP (hx of syncope); cardiac cath 11/8 Restrictions Weight Bearing Restrictions: No      Mobility  Bed Mobility Overal bed mobility: Modified Independent             General bed mobility comments: Pt able to perform all bed mobility aspects safely without assistance, HOB elevated.    Transfers Overall transfer level: Needs assistance Equipment used: None Transfers: Sit to/from Stand Sit to Stand: Supervision           General transfer comment: Supervision for safety, slow to rise with wide BOS with mild sway but no LOB    Ambulation/Gait Ambulation/Gait assistance: Supervision Gait Distance (Feet): 250 Feet Assistive device: None Gait Pattern/deviations: Step-through pattern, Decreased stride length, Wide base of support Gait velocity: reduced Gait velocity interpretation: <1.31 ft/sec, indicative of household ambulator   General Gait Details: Pt with semi-slow gait and excess lateral trunk sway and wide BOS. Gait speed and fluidity improved as distance progressed, likely as his chronically sore joints warmed-up with increased distance. Pt reports he is normally stiff with his first time walking in the morning, like today.  Stairs            Wheelchair Mobility    Modified Rankin (Stroke Patients Only)       Balance Overall balance assessment: Mild deficits observed, not formally tested  Pertinent Vitals/Pain Pain Assessment Pain Assessment: Faces Faces Pain Scale: Hurts little more Pain Location: R knee& hip, L ankle (chronically for all) Pain Descriptors / Indicators: Discomfort Pain Intervention(s): Limited activity within patient's tolerance, Monitored during session    Home Living  Family/patient expects to be discharged to:: Private residence Living Arrangements: Spouse/significant other Available Help at Discharge: Family;Available 24 hours/day (wife limited in amount of assistance she can provide) Type of Home: House Home Access: Stairs to enter Entrance Stairs-Rails: None Entrance Stairs-Number of Steps: 1 (short step)   Home Layout: One level Home Equipment: Conservation officer, nature (2 wheels);Rollator (4 wheels);BSC/3in1;Tub bench;Cane - single point      Prior Function Prior Level of Function : Independent/Modified Independent;Driving             Mobility Comments: Intermittently uses SPC in community due to chronic R hip and knee pain, but otherwise is independent without AD. Hx of syncopal episodes recently.       Hand Dominance        Extremity/Trunk Assessment   Upper Extremity Assessment Upper Extremity Assessment: Overall WFL for tasks assessed    Lower Extremity Assessment Lower Extremity Assessment: RLE deficits/detail;LLE deficits/detail RLE Deficits / Details: chronic R hip and knee pain impacting mobility; gross weakness noted LLE Deficits / Details: chronic L ankle pain impacting mobility; gross weakness noted    Cervical / Trunk Assessment Cervical / Trunk Assessment: Normal  Communication   Communication: No difficulties  Cognition Arousal/Alertness: Awake/alert Behavior During Therapy: WFL for tasks assessed/performed Overall Cognitive Status: Within Functional Limits for tasks assessed                                 General Comments: appears to potentially have poor medical understanding, needing information repeated in various ways before pt appeared to comprehend the question        General Comments General comments (skin integrity, edema, etc.): 5MW Test = 1- 10.93 seconds, 1.5 ft/sec; 2-9.95 sec, 1.65 ft/sec; 3-9.77 sec, 1.68 ft/sec; 5MW test average of 1.61 ft/sec of the 3 trials; BP 132/79 sitting, 119/96  standing, 120/88 standing ~3 min, 128/66 sitting after ambulating, denied any symptoms today    Exercises     Assessment/Plan    PT Assessment Patient needs continued PT services  PT Problem List Decreased strength;Decreased activity tolerance;Decreased balance;Decreased mobility       PT Treatment Interventions DME instruction;Gait training;Stair training;Functional mobility training;Therapeutic activities;Therapeutic exercise;Neuromuscular re-education;Balance training;Patient/family education    PT Goals (Current goals can be found in the Care Plan section)  Acute Rehab PT Goals Patient Stated Goal: to get better PT Goal Formulation: With patient Time For Goal Achievement: 07/25/22 Potential to Achieve Goals: Good    Frequency Min 3X/week     Co-evaluation               AM-PAC PT "6 Clicks" Mobility  Outcome Measure Help needed turning from your back to your side while in a flat bed without using bedrails?: None Help needed moving from lying on your back to sitting on the side of a flat bed without using bedrails?: None Help needed moving to and from a bed to a chair (including a wheelchair)?: A Little Help needed standing up from a chair using your arms (e.g., wheelchair or bedside chair)?: A Little Help needed to walk in hospital room?: A Little Help needed climbing 3-5 steps with a railing? : A Little 6 Click  Score: 20    End of Session Equipment Utilized During Treatment: Gait belt Activity Tolerance: Patient tolerated treatment well Patient left: in bed;with call bell/phone within reach Nurse Communication: Mobility status;Other (comment) (BP) PT Visit Diagnosis: Unsteadiness on feet (R26.81);Other abnormalities of gait and mobility (R26.89);Muscle weakness (generalized) (M62.81);Difficulty in walking, not elsewhere classified (R26.2)    Time: 7902-4097 PT Time Calculation (min) (ACUTE ONLY): 20 min   Charges:   PT Evaluation $PT Eval Low Complexity: 1  Low          Moishe Spice, PT, DPT Acute Rehabilitation Services  Office: [phone]   Orvan Falconer 07/11/2022, 9:46 AM

## 2022-07-11 NOTE — Consult Note (Signed)
[facility]   MULTIDISCIPLINARY HEART VALVE CLINIC       [address]       [place] [address]             [phone]          CARDIOTHORACIC SURGERY CONSULTATION REPORT  PCP is Andree Elk Susanne Greenhouse, FNP Referring Provider is Sherren Mocha, mD Primary Cardiologist is Dr. Dellia Cloud  Reason for consultation: Critical aortic stenosis  HPI:  The patient is an 81 year old gentleman with history of diabetes, hyperlipidemia, prostate cancer status postradiation treatment, and critical aortic stenosis who was referred for consideration of TAVR. He presented to [facility] 07/07/22 after a syncopal episode the day of admission. He reported that earlier that same day he experienced profound lightheadedness and EMS was called however he declined transportation to the hospital. Later in the afternoon, he was outside doing light yard work when he developed severe dizziness and weakness. He sat down on a bench, however it is reported that his wife found him down in the yard. Estimated LOC duration is about 1 minute. He reports a similar episode earlier this year which occurred when he was walking back and forth from his mailbox. He is otherwise very functional at baseline and states he usually performs yard work and other physical activities without chest pain or progressive dyspnea on exertion. He denies recent orthopnea, PND, or LE edema. He  has been followed for some time by Dr. Sabra Heck in [place], [place] for a heart murmur however has never been referred to see a cardiologist. He has no prior cardiac history. He follows regularly with a dentist with no active issues. His wife had TAVR with Dr. Burt Knack and myself in 2020.    Due to the above symptoms he was admitted for further work-up. Echocardiogram and carotid dopplers were obtained. Carotid dopplers with R/L carotids <50% stenosed. Echocardiogram unfortunately showed preserved LV function at 60-65% with evidence of critical aortic  stenosis with an AVA by VTI at 0.4cm2, mean gradient at 59.84mHg, peak at 107.142mg, DI at 0.15, and SVI at 26. Given critical AS, [facility] with R/LHC, structural heart.surgical consultation, and pre TAVR CT imaging.     Past Medical History:  Diagnosis Date   Arthritis    Cancer ([facility]   prostate cancer   Diabetes mellitus without complication ([facility]   Hyperlipemia    Vitamin D deficiency     Past Surgical History:  Procedure Laterality Date   RIGHT HEART CATH AND CORONARY ANGIOGRAPHY N/A 07/09/2022   Procedure: RIGHT HEART CATH AND CORONARY ANGIOGRAPHY;  Surgeon: KeTroy SineMD;  [facility];  Service: Cardiovascular;  Laterality: N/A;   right knee sx      Family History  Problem Relation Age of Onset   Heart murmur Mother     Social History   Socioeconomic History   Marital status: Married    Spouse name: Not on file   Number of children: Not on file   Years of education: Not on file   Highest education level: Not on file  Occupational History   Not on file  Tobacco Use   Smoking status: Former   Smokeless tobacco: Never  Vaping Use   Vaping Use: Never used  Substance and Sexual Activity   Alcohol use: Never   Drug use: Never   Sexual activity: Not on file  Other Topics Concern  Not on file  Social History Narrative   Not on file   Social Determinants of Health   Financial Resource Strain: Not on file  Food Insecurity: No Food Insecurity (07/09/2022)   Hunger Vital Sign    Worried About Running Out of Food in the Last Year: Never true    Ran Out of Food in the Last Year: Never true  Transportation Needs: No Transportation Needs (07/09/2022)   PRAPARE - Hydrologist (Medical): No    Lack of Transportation (Non-Medical): No  Physical Activity: Not on file  Stress: Not on file  Social Connections: Not on file  Intimate Partner Violence: Not At  Risk (07/09/2022)   Humiliation, Afraid, Rape, and Kick questionnaire    Fear of Current or Ex-Partner: No    Emotionally Abused: No    Physically Abused: No    Sexually Abused: No    Prior to Admission medications   Medication Sig Start Date End Date Taking? Authorizing Provider  Cholecalciferol (VITAMIN D3) 1.25 MG (50000 UT) CAPS Take 1 capsule by mouth daily.   Yes [provider]  doxazosin (CARDURA) 8 MG tablet Take 8 mg by mouth daily.   Yes [provider]  glipiZIDE (GLUCOTROL XL) 5 MG 24 hr tablet Take 5 mg by mouth daily with breakfast.   Yes [provider]  lovastatin (MEVACOR) 40 MG tablet Take 80 mg by mouth at bedtime.   Yes [provider]  diclofenac Sodium (VOLTAREN) 1 % GEL Apply 2 g topically 4 (four) times daily as needed (pain). 02/20/20   Long, Wonda Olds, MD    Current Facility-Administered Medications  Medication Dose Route Frequency Provider Last Rate Last Admin   0.9 %  sodium chloride infusion  250 mL Intravenous PRN Ahmed Prima, [place] M, PA-C       0.9 %  sodium chloride infusion  250 mL Intravenous PRN Troy Sine, MD       acetaminophen (TYLENOL) tablet 650 mg  650 mg Oral Q4H PRN Troy Sine, MD       aspirin EC tablet 81 mg  81 mg Oral Daily Shahmehdi, Seyed A, MD   81 mg at 07/11/22 0850   diclofenac Sodium (VOLTAREN) 1 % topical gel 2 g  2 g Topical QID PRN Bernerd Pho M, PA-C       heparin injection 5,000 Units  5,000 Units Subcutaneous Q8H Lyndee Leo, RPH   5,000 Units at 07/11/22 1459   insulin aspart (novoLOG) injection 0-15 Units  0-15 Units Subcutaneous TID WC Erma Heritage, PA-C   3 Units at 07/11/22 1253   insulin aspart (novoLOG) injection 0-5 Units  0-5 Units Subcutaneous QHS Strader, [place] M, PA-C       insulin glargine-yfgn [place]) injection 10 Units  10 Units Subcutaneous QHS Erma Heritage, PA-C   10 Units at 07/11/22 0033   ondansetron (ZOFRAN) tablet 4 mg  4 mg Oral Q6H  PRN Bernerd Pho M, PA-C       Or   ondansetron [facility]) injection 4 mg  4 mg Intravenous Q6H PRN Ahmed Prima, [place] M, PA-C       pravastatin (PRAVACHOL) tablet 40 mg  40 mg Oral q1800 Bernerd Pho M, PA-C   40 mg at 07/10/22 1806   sodium chloride flush (NS) 0.9 % injection 3 mL  3 mL Intravenous Q12H Bernerd Pho M, PA-C   3 mL at 07/11/22 0851   sodium chloride flush (NS)  0.9 % injection 3 mL  3 mL Intravenous PRN Ahmed Prima, [place] M, PA-C       sodium chloride flush (NS) 0.9 % injection 3 mL  3 mL Intravenous Q12H Troy Sine, MD   3 mL at 07/11/22 0851   sodium chloride flush (NS) 0.9 % injection 3 mL  3 mL Intravenous PRN Troy Sine, MD        No Known Allergies    Review of Systems:   General:  normal appetite, + decreased energy, no weight gain, no weight loss, no fever  Cardiac:  no chest pain with exertion, no chest pain at rest, no SOB with  exertion, no resting SOB, no PND, no orthopnea, no palpitations, no arrhythmia, no atrial fibrillation, no LE edema, + dizzy spells, + syncope  Respiratory:  no shortness of breath, no home oxygen, no productive cough, no dry cough, no bronchitis, no wheezing, no hemoptysis, no asthma, no pain with inspiration or cough, no sleep apnea, no CPAP at night  GI:   no difficulty swallowing, no reflux, no frequent heartburn, no hiatal hernia, no abdominal pain, no constipation, no diarrhea, no hematochezia, no hematemesis, no melena  GU:   no dysuria,  no frequency, no urinary tract infection, no hematuria, + enlarged prostate, no kidney stones, no kidney disease  Vascular:  no pain suggestive of claudication, no pain in feet, no leg cramps, no varicose veins, no DVT, no non-healing foot ulcer  Neuro:   no stroke, no TIA's, no seizures, no headaches, no temporary blindness one eye,  no slurred speech, no peripheral neuropathy, no chronic pain, no instability of gait, no memory/cognitive dysfunction  Musculoskeletal: no  arthritis, no joint swelling, no myalgias, no difficulty walking, normal mobility   Skin:   no rash, no itching, no skin infections, no pressure sores or ulcerations  Psych:   no anxiety, no depression, no nervousness, no unusual recent stress  Eyes:   no blurry vision, no floaters, no recent vision changes, + wears glasses   ENT:   bi hearing loss, bi loose or painful teeth, bi dentures, sees dentist regularly  Hematologic:  no easy bruising, no abnormal bleeding, no clotting disorder, no frequent epistaxis  Endocrine:  + diabetes, does check CBG's at home     Physical Exam:   BP (!) 120/54 (BP Location: Left Arm)   Pulse 64   Temp 98.2 F (36.8 C) (Oral)   Resp 18   Ht '5\' 9"'$  (1.753 m)   Wt 112.6 kg   SpO2 90%   BMI 36.65 kg/m   General:  Obese,  well-appearing  HEENT:  Unremarkable, NCAT, PERLA, EOMI  Neck:   no JVD, no bruits, no adenopathy   Chest:   clear to auscultation, symmetrical breath sounds, no wheezes, no rhonchi   CV:   RRR, 3/6 systolic murmur RSB, no diastolic murmur  Abdomen:  soft, non-tender, no masses   Extremities:  warm, well-perfused, pulses palpable at ankles, no lower extremity edema  Rectal/GU  Deferred  Neuro:   Grossly non-focal and symmetrical throughout  Skin:   Clean and dry, no rashes, no breakdown  Diagnostic Tests:  Lab Results: Recent Labs    07/09/22 0424 07/09/22 1159 07/09/22 1207 07/10/22 0222  WBC 6.0  --   --  6.0  HGB 11.9*   < > 12.2* 12.1*  HCT 36.4*   < > 36.0* 36.8*  PLT 127*  --   --  132*   < > =  values in this interval not displayed.   BMET:  Recent Labs    07/10/22 0222 07/11/22 0145  NA 138 134*  K 4.1 4.0  CL 105 100  CO2 27 26  GLUCOSE 128* 134*  BUN 14 15  CREATININE 1.66* 1.65*  CALCIUM 9.7 9.7    CBG (last 3)  Recent Labs    07/11/22 0834 07/11/22 1211 07/11/22 1609  GLUCAP 146* 170* 141*   ECHOCARDIOGRAM REPORT       Patient Name:   Gerald Crosby Date of Exam: 07/08/2022  Medical Rec #:   [id number]       Height:       69.0 in  Accession #:    [id number]      Weight:       259.5 lb  Date of Birth:  [birth year]       BSA:          2.308 m  Patient Age:    19 years        BP:           124/55 mmHg  Patient Gender: M               HR:           71 bpm.  Exam Location:  Inpatient   Procedure: 2D Echo, 3D Echo, Cardiac Doppler and Color Doppler   Indications:    Syncope R55    History:        Patient has no prior history of Echocardiogram  examinations.                 Signs/Symptoms:Syncope; Risk Factors:Former Smoker,  Diabetes and                  Dyslipidemia.    Sonographer:    Greer Pickerel  Referring Phys: [id number] OLADAPO ADEFESO     Sonographer Comments: Patient is obese. Image acquisition challenging due  to respiratory motion.  IMPRESSIONS     1. Left ventricular ejection fraction, by estimation, is 60 to 65%. The  left ventricle has normal function. The left ventricle has no regional  wall motion abnormalities. There is severe left ventricular hypertrophy.  Left ventricular diastolic parameters   are indeterminate.   2. Right ventricular systolic function is normal. The right ventricular  size is normal. There is normal pulmonary artery systolic pressure.   3. There is severe calcifcation of the aortic valve. There is evidence of  critical aortic valve stenosis with Vmax 5.7 m/sec, mean pressure gradient  73 mm Hg and aortic valve area of 0.4 cm2 and trivial aortic valve  regurgitation.   4. The mitral valve is grossly normal. Trivial mitral valve  regurgitation. No evidence of mitral stenosis.   5. The inferior vena cava is dilated in size with <50% respiratory  variability, suggesting right atrial pressure of 15 mmHg.   FINDINGS   Left Ventricle: Left ventricular ejection fraction, by estimation, is 60  to 65%. The left ventricle has normal function. The left ventricle has no  regional wall motion abnormalities. The left ventricular internal cavity   size was normal in size. There is   severe left ventricular hypertrophy. Left ventricular diastolic  parameters are indeterminate.   Right Ventricle: The right ventricular size is normal. No increase in  right ventricular wall thickness. Right ventricular systolic function is  normal. There is normal pulmonary artery systolic pressure. The tricuspid  regurgitant velocity is 1.97 m/s, and  with an assumed right atrial pressure of 8 mmHg, the estimated right  ventricular systolic pressure is 28.7 mmHg.   Left Atrium: Left atrial size was normal in size.   Right Atrium: Right atrial size was normal in size.   Pericardium: There is no evidence of pericardial effusion.   Mitral Valve: The mitral valve is grossly normal. Trivial mitral valve  regurgitation. No evidence of mitral valve stenosis.   Tricuspid Valve: The tricuspid valve is not well visualized. Tricuspid  valve regurgitation is not demonstrated. No evidence of tricuspid  stenosis.   Aortic Valve: There is severe calcifcation of the aortic valve. There is  severe aortic valve annular calcification. Aortic valve regurgitation is  trivial. Severe aortic stenosis is present. Aortic valve mean gradient  measures 59.7 mmHg. Aortic valve peak   gradient measures 107.1 mmHg. Aortic valve area, by VTI measures 0.48  cm.   Pulmonic Valve: The pulmonic valve was not well visualized. Pulmonic valve  regurgitation is not visualized. No evidence of pulmonic stenosis.   Aorta: The aortic root is normal in size and structure.   Venous: The inferior vena cava is dilated in size with less than 50%  respiratory variability, suggesting right atrial pressure of 15 mmHg.   IAS/Shunts: No atrial level shunt detected by color flow Doppler.     LEFT VENTRICLE  PLAX 2D  LVIDd:         4.60 cm   Diastology  LVIDs:         3.20 cm   LV e' medial:    4.46 cm/s  LV PW:         1.20 cm   LV E/e' medial:  20.9  LV IVS:        0.90 cm   LV e'  lateral:   5.22 cm/s  LVOT diam:     2.00 cm   LV E/e' lateral: 17.9  LV SV:         60  LV SV Index:   26  LVOT Area:     3.14 cm                             3D Volume EF:                           3D EF:        57 %                           LV EDV:       242 ml                           LV ESV:       105 ml                           LV SV:        137 ml   RIGHT VENTRICLE  RV S prime:     12.30 cm/s  TAPSE (M-mode): 2.3 cm   LEFT ATRIUM           Index        RIGHT ATRIUM           Index  LA diam:      4.50 cm 1.95 cm/m  RA Area:     11.30 cm  LA Vol (A2C): 68.4 ml 29.64 ml/m  RA Volume:   22.70 ml  9.84 ml/m  LA Vol (A4C): 73.5 ml 31.85 ml/m   AORTIC VALVE  AV Area (Vmax):    0.56 cm  AV Area (Vmean):   0.54 cm  AV Area (VTI):     0.48 cm  AV Vmax:           517.33 cm/s  AV Vmean:          357.667 cm/s  AV VTI:            1.240 m  AV Peak Grad:      107.1 mmHg  AV Mean Grad:      59.7 mmHg  LVOT Vmax:         93.00 cm/s  LVOT Vmean:        61.300 cm/s  LVOT VTI:          0.191 m  LVOT/AV VTI ratio: 0.15    AORTA  Ao Root diam: 3.60 cm  Ao Asc diam:  3.80 cm   MITRAL VALVE                TRICUSPID VALVE  MV Area (PHT): 3.03 cm     TR Peak grad:   15.5 mmHg  MV Decel Time: 250 msec     TR Vmax:        197.00 cm/s  MV E velocity: 93.40 cm/s  MV A velocity: 111.00 cm/s  SHUNTS  MV E/A ratio:  0.84         Systemic VTI:  0.19 m                              Systemic Diam: 2.00 cm   Vishnu Priya Mallipeddi  Electronically signed by Lorelee Cover Mallipeddi  Signature Date/Time: 07/08/2022/12:25:11 PM        Final      Physicians  Panel Physicians Referring Physician Case Authorizing Physician  Troy Sine, MD (Primary)     Procedures  RIGHT HEART CATH AND CORONARY ANGIOGRAPHY   Conclusion      Dist Cx lesion is 10% stenosed.   Mid LAD lesion is 10% stenosed.   No significant coronary obstructive disease with large relatively normal LAD,  ramus intermediate, left circumflex, and RCA.   Severely calcified aortic valve with significant restriction of motion. No attempt to cross the aortic valve with echo Doppler data from yesterday showing critical AS with a mean gradient of 73 mmHg, peak to peak gradient of 107 mmHg, and aortic valve area estimate of 0.4 to 0.5 cm.   RECOMMENDATION: Patient will be admitted.  Surgical consultation for aortic valve replacement.  Will need scheduling with CT imaging per structural team.   Procedural Details  Technical Details Mr. Hurley Sobel is an 81 year old gentleman who has a history of hypertension, hyperlipidemia, diabetes mellitus, and was admitted to [facility] following syncopal spell.  He was found to have severe/critical aortic valve stenosis on echocardiography with EF estimated 60 to 65%, mean aortic valve gradient at 73 mm with estimated aortic valve area of 0.4 cm.  Troponins are mildly positive with a plateau curve suggestive of demand ischemia.  He is now referred for right and left heart cardiac catheterization.  The patient was brought to the cardiac catheterization lab in the fasting state. The patient was premedicated  with Versed 1 mg and fentanyl 25 mcg.an IV was placed in the right brachial vein.  This was exchanged under sterile technique for 6/7 French Glidesheath slender sheath.  A 7 French Swan-Ganz catheter was then inserted and advanced to the RA, RV, PA, and pulmonary capillary wedge positions with hemodynamic monitoring.  O2 saturation was obtained in the pulmonary artery x2.  Ultrasound guidance was used for right radial artery access.  The right radial artery was punctured via the Seldinger technique, and a 6 [place] Glidesheath Slender was inserted without difficulty.  A radial cocktail consisting of Verapamil 3 mg was administered. The patient received weight adjusted heparin. A safety J wire was advanced into the ascending aorta. Diagnostic catheterization was  done with a 5 [place] TIG 4.0 catheter.  Oxygen saturation was obtained in the central aorta.  With the demonstration of a severely calcified aortic valve and echo data showing critical aortic stenosis, the aortic valve was not crossed. A TR radial band was applied for hemostasis. The patient left the catheterization laboratory in stable condition.    Estimated blood loss <50 mL.   During this procedure medications were administered to achieve and maintain moderate conscious sedation while the patient's heart rate, blood pressure, and oxygen saturation were continuously monitored and I was present face-to-face 100% of this time.   Medications (Filter: Administrations occurring from 1125 to 1223 on 07/09/22)  important  Continuous medications are totaled by the amount administered until 07/09/22 1223.   Heparin (Porcine) in NaCl 1000-0.9 UT/500ML-% SOLN (mL)  Total volume: 1,000 mL Date/Time Rate/Dose/Volume Action   07/09/22 1137 500 mL Given   1137 500 mL Given   fentaNYL (SUBLIMAZE) injection (mcg)  Total dose: 25 mcg Date/Time Rate/Dose/Volume Action   07/09/22 1144 25 mcg Given   midazolam (VERSED) injection (mg)  Total dose: 1 mg Date/Time Rate/Dose/Volume Action   07/09/22 1144 1 mg Given   lidocaine (PF) (XYLOCAINE) 1 % injection (mL)  Total volume: 5 mL Date/Time Rate/Dose/Volume Action   07/09/22 1150 5 mL Given   Radial Cocktail/Verapamil only (mL)  Total volume: 10 mL Date/Time Rate/Dose/Volume Action   07/09/22 1202 10 mL Given   heparin sodium (porcine) injection (Units)  Total dose: 6,000 Units Date/Time Rate/Dose/Volume Action   07/09/22 1205 6,000 Units Given   iohexol (OMNIPAQUE) 350 MG/ML injection (mL)  Total volume: 52 mL Date/Time Rate/Dose/Volume Action   07/09/22 1220 52 mL Given   insulin aspart (novoLOG) injection 0-15 Units (Units)  Total dose: Cannot be calculated* Dosing weight: 114.3 *Administration dose not documented Date/Time Rate/Dose/Volume  Action   07/09/22 1200 *Not included in total Automatically Held   heparin ADULT infusion 100 units/mL (25000 units/242m) (Units/hr)  Total dose: Cannot be calculated* Dosing weight: 117.7 *Administration dose not documented Date/Time Rate/Dose/Volume Action   07/09/22 1130  Stopped    Sedation Time  Sedation Time Physician-1: 32 minutes 51 seconds Contrast  Medication Name Total Dose  iohexol (OMNIPAQUE) 350 MG/ML injection 52 mL   Radiation/Fluoro  Fluoro time: 3.3 (min) DAP: 15666 (mGycm2) Cumulative Air Kerma: 2045(mGy) Complications  Complications documented before study signed (07/09/2022 140:98PM)   No complications were associated with this study.  Documented by RDarla Lesches RN - 07/09/2022 12:21 PM     Coronary Findings  Diagnostic Dominance: Right Left Anterior Descending  Mid LAD lesion is 10% stenosed.    Left Circumflex  Dist Cx lesion is 10% stenosed.    Intervention   No interventions have been documented.  Right Heart  Right Atrium RA: A-wave 17, V wave 15; mean 15 RV: 55/15 PA: 54/23; mean 31 PW: A-wave 26 V wave 26; mean 20  Oxygen saturation in the pulmonary artery 59% in the central aorta 96%.   Left Heart  Aortic Valve The aortic valve is calcified. There is abnormal aortic valve motion.   Coronary Diagrams  Diagnostic Dominance: Right  Intervention   Implants   No implant documentation for this case.   Syngo Images   Show images for CARDIAC CATHETERIZATION Images on Long Term Storage   Show images for Arley, Salamone to Procedure Log  Procedure Log    Hemo Data  Flowsheet Row Most Recent Value  Fick Cardiac Output 5.09 L/min  Fick Cardiac Output Index 2.22 (L/min)/BSA  RA A Wave 17 mmHg  RA V Wave 15 mmHg  RA Mean 15 mmHg  RV Systolic Pressure 55 mmHg  RV Diastolic Pressure 4 mmHg  RV EDP 15 mmHg  PA Systolic Pressure 54 mmHg  PA Diastolic Pressure 23 mmHg  PA Mean 31 mmHg  PW A Wave 26 mmHg  PW  V Wave 26 mmHg  PW Mean 20 mmHg  AO Systolic Pressure 462 mmHg  AO Diastolic Pressure 80 mmHg  AO Mean 83 mmHg  QP/QS 1  TPVR Index 13.96 HRUI  TSVR Index 37.37 HRUI  PVR SVR Ratio 0.16  TPVR/TSVR Ratio 0.37   ADDENDUM REPORT: 07/11/2022 08:37   EXAM: OVER-READ INTERPRETATION  CT CHEST   The following report is an over-read performed by radiologist Dr. Maudry Diego [facility] on 07/11/2022. This over-read does not include interpretation of cardiac or coronary anatomy or pathology. The cardiac TAVR interpretation by the cardiologist is attached.   COMPARISON:  None.   FINDINGS: Extracardiac findings will be described separately under dictation for contemporaneously obtained CTA chest, abdomen and pelvis.   IMPRESSION: Please see separate dictation for contemporaneously obtained CTA chest, abdomen and pelvis dated 07/11/2022 for full description of relevant extracardiac findings.     Electronically Signed   By: Yetta Glassman M.D.   On: 07/11/2022 08:37    Addended by Dena Billet, MD on 07/11/2022  8:40 AM    Study Result  Narrative & Impression  CLINICAL DATA:  Aortic Stenosis   EXAM: Cardiac TAVR CT   TECHNIQUE: The patient was scanned on a Siemens Force 703 slice scanner. A 120 kV retrospective scan was triggered in the ascending thoracic aorta at 140 HU's. Gantry rotation speed was 250 msecs and collimation was .6 mm. No beta blockade or nitro were given. The 3D data set was reconstructed in 5% intervals of the R-R cycle. Systolic and diastolic phases were analyzed on a dedicated work station using MPR, MIP and VRT modes. The patient received 80 cc of contrast.   FINDINGS: Aortic Valve: Tri leaflet calcified with score  3631   Aorta: Normal arch vessels no aneurysm moderate calcific atherosclerosis   Sino-tubular Junction: 27.4 mm   Ascending Thoracic Aorta: 33 mm   Aortic Arch: 24 mm   Descending Thoracic Aorta: 24  mm   Sinus of Valsalva Measurements:   Non-coronary: 30.7 mm   Right - coronary: 28 mm   Left -   coronary: 30.7 mm   Coronary Artery Height above Annulus:   Left Main: 10 mm above annulus   Right Coronary: 12.1 mm above annulus   Virtual Basal Annulus Measurements:   Maximum / Minimum Diameter: 23.7 mm x 22 mm  Perimeter: 79.8 mm   Area: 459 mm2   Coronary Arteries: Sufficient height above annulus for deployment LM a bit shallow   Optimum Fluoroscopic Angle for Delivery: LAO 18 Caudal 11 degrees   IMPRESSION: 1. Calcified tri leaflet AV with score 3631   2.  Annular area of 459 mm2 suitable for a 26 mm Sapien 3 valve   3. Optimum angiographic angle for deployment LAO 18 Caudal 11 degrees   4.  Coronary arteries sufficient height for deployment LM - 10 mm   5.  Membranous septal length 7.4 mm   Jenkins Rouge   Electronically Signed: By: Jenkins Rouge M.D. On: 07/10/2022 16:39    Narrative & Impression  CLINICAL DATA:  Preop evaluation for aortic valve replacement   EXAM: CT ANGIOGRAPHY CHEST, ABDOMEN AND PELVIS   TECHNIQUE: Non-contrast CT of the chest was initially obtained.   Multidetector CT imaging through the chest, abdomen and pelvis was performed using the standard protocol during bolus administration of intravenous contrast. Multiplanar reconstructed images and MIPs were obtained and reviewed to evaluate the vascular anatomy.   RADIATION DOSE REDUCTION: This exam was performed according to the departmental dose-optimization program which includes automated exposure control, adjustment of the mA and/or kV according to patient size and/or use of iterative reconstruction technique.   CONTRAST:  118m OMNIPAQUE IOHEXOL 350 MG/ML SOLN   COMPARISON:  None Available.   FINDINGS: CTA CHEST FINDINGS   Cardiovascular: Cardiomegaly. Aortic valve thickening and calcifications. Normal caliber thoracic aorta with moderate atherosclerotic disease.  Standard three-vessel aortic arch with no significant stenosis. Left main lad and RCA coronary artery calcifications.   Mediastinum/Nodes: Esophagus and thyroid are unremarkable. No pathologically enlarged lymph nodes seen in the chest.   Lungs/Pleura: Central airways are patent. Bibasilar atelectasis. No consolidation, pleural effusion or pneumothorax.   Musculoskeletal: No chest wall abnormality. No acute or significant osseous findings.   CTA ABDOMEN AND PELVIS FINDINGS   Hepatobiliary: No focal liver abnormality is seen. No gallstones, gallbladder wall thickening, or biliary dilatation.   Pancreas: Unremarkable. No pancreatic ductal dilatation or surrounding inflammatory changes.   Spleen: Normal in size without focal abnormality.   Adrenals/Urinary Tract: Bilateral adrenal glands are unremarkable. No hydronephrosis or nephrolithiasis. Bilateral low-attenuation renal lesions, largest are compatible with simple cysts, others are too small to completely characterize, no specific follow-up imaging is recommended. Bladder is unremarkable.   Stomach/Bowel: Stomach is within normal limits. Appendix appears normal. No evidence of bowel wall thickening, distention, or inflammatory changes.   Vascular/lymphatic: Normal caliber abdominal aorta with moderate atherosclerotic disease. No significant stenosis. No pathologically enlarged lymph nodes in the abdomen or pelvis.   Reproductive: Mild median lobe hypertrophy.   Other: No abdominal wall hernia or abnormality. Trace abdominal ascites.   Musculoskeletal: No acute or significant osseous findings.   VASCULAR MEASUREMENTS PERTINENT TO TAVR:   AORTA:   Minimal Aortic Diameter-13.3 mm   Severity of Aortic Calcification-moderate   RIGHT PELVIS:   Right Common Iliac Artery -   Minimal Diameter-6.9 mm   Tortuosity-mild   Calcification-moderate   Right External Iliac Artery -   Minimal Diameter-7.7 mm    Tortuosity-moderate   Calcification-mild   Right Common Femoral Artery -   Minimal Diameter-6.0 mm   Tortuosity-mild   Calcification-mild   LEFT PELVIS:   Left Common Iliac Artery -   Minimal Diameter-6.9 mm   Tortuosity-none   Calcification-moderate   Left External Iliac Artery -   Minimal Diameter-8.3 mm   Tortuosity-moderate  Calcification-none   Left Common Femoral Artery -   Minimal Diameter-7.7 mm   Tortuosity-mild   Calcification-mild   Review of the MIP images confirms the above findings.   IMPRESSION: 1. Vascular findings and measurements pertinent to potential TAVR procedure, as detailed above. 2. Thickening and calcification of the aortic valve, compatible with reported clinical history of aortic stenosis. 3. Moderate aortoiliac atherosclerosis. Left main lad and RCA coronary artery calcifications.     Electronically Signed   By: Yetta Glassman M.D.   On: 07/11/2022 08:36   STS Risk Calculator: Procedure Type: Isolated AVR Perioperative Outcome           Estimate % Operative Mortality      4.77% Morbidity & Mortality   17.2% Stroke  1.82% Renal Failure   3.67% Reoperation    5.01% Prolonged Ventilation  8.71% Deep Sternal Wound Infection           0.072% [place] [facility] Stay (>14 days)           14.7% Short Hospital Stay (<6 days)*           17.9%     [facility] Cardiomyopathy Questionnaire      07/09/2022   11:41 AM  KCCQ-12  1 a. Ability to shower/bathe Not at all limited  1 b. Ability to walk 1 block Not at all limited  1 c. Ability to hurry/jog Other, Did not do  2. Edema feet/ankles/legs Less than once a week  3. Limited by fatigue Less than once a week  4. Limited by dyspnea Less than once a week  5. Sitting up / on 3+ pillows Never over the past 2 weeks  6. Limited enjoyment of life Not limited at all  7. Rest of life w/ symptoms Mostly satisfied  8 a. Participation in hobbies Did not limit at all  8 b.  Participation in chores Did not limit at all  8 c. Visiting family/friends Did not limit at all   Impression:  Gerald Crosby is a 81 y.o. male with symptoms of severe, stage D1 aortic stenosis with NYHA Class ll symptoms of marked exertional fatigue followed by dizziness and syncope. I have reviewed the patient's recent echocardiogram which is notable for normal LV systolic function and critical aortic stenosis with peak gradient of 107.63mHg and mean transvalvular gradient of 59.752mg. The patient's dimensionless index is 0.15 and calculated aortic valve area is 0.4cm.    R/LHC demonstrated patent coronary arteries with mild nonobstructive CAD. His right heart pressures were noted to be elevated. CT imaging showed a calcified AV with a calcium score at 3631 with an area at 45655msuitable for a 71m30mUR valve.   Given his advanced age I think that transcatheter aortic valve replacement be the best option for treating him.  His gated cardiac CTA shows anatomy suitable for TAVR using a 26 mm SAPIEN 3 valve.  His abdominal and pelvic CTA shows adequate pelvic vascular anatomy to allow transfemoral insertion.  The patient was counseled at length regarding treatment alternatives for management of severe symptomatic aortic stenosis. The risks and benefits of surgical intervention has been discussed in detail. Long-term prognosis with medical therapy was discussed. Alternative approaches such as conventional surgical aortic valve replacement, transcatheter aortic valve replacement, and palliative medical therapy were compared and contrasted at length. This discussion was placed in the context of the patient's own specific clinical presentation and past medical history. All of his questions have been addressed.   Following the  decision to proceed with transcatheter aortic valve replacement, a discussion was held regarding what types of management strategies would be attempted intraoperatively in the event  of life-threatening complications, including whether or not the patient would be considered a candidate for the use of cardiopulmonary bypass and/or conversion to open sternotomy for attempted surgical intervention.  He is 81 years old so I would only consider emergency sternotomy to manage any intraoperative problems depending on the circumstances.  The patient is aware of the fact that transient use of cardiopulmonary bypass may be necessary. The patient has been advised of a variety of complications that might develop including but not limited to risks of death, stroke, paravalvular leak, aortic dissection or other major vascular complications, aortic annulus rupture, device embolization, cardiac rupture or perforation, mitral regurgitation, acute myocardial infarction, arrhythmia, heart block or bradycardia requiring permanent pacemaker placement, congestive heart failure, respiratory failure, renal failure, pneumonia, infection, other late complications related to structural valve deterioration or migration, or other complications that might ultimately cause a temporary or permanent loss of functional independence or other long term morbidity. The patient provides full informed consent for the procedure as described and all questions were answered.      Plan:  He will be scheduled for transfemoral TAVR using a SAPIEN 3 valve on Tuesday, 07/15/2022.   Gaye Pollack, MD 07/11/2022

## 2022-07-12 DIAGNOSIS — I350 Nonrheumatic aortic (valve) stenosis: Secondary | ICD-10-CM | POA: Diagnosis not present

## 2022-07-12 LAB — GLUCOSE, CAPILLARY
Glucose-Capillary: 182 mg/dL — ABNORMAL HIGH (ref 70–99)
Glucose-Capillary: 128 mg/dL — ABNORMAL HIGH (ref 70–99)
Glucose-Capillary: 211 mg/dL — ABNORMAL HIGH (ref 70–99)
Glucose-Capillary: 125 mg/dL — ABNORMAL HIGH (ref 70–99)

## 2022-07-12 NOTE — Progress Notes (Signed)
Mobility Specialist - Progress Note   07/12/22 1118  Mobility  Activity Ambulated independently in hallway  Level of Assistance Independent  Assistive Device None  Distance Ambulated (ft) 260 ft  Activity Response Tolerated well  Mobility Referral Yes  $Mobility charge 1 Mobility    Pre-mobility: 86 HR During mobility: 95 HR Post-mobility:75 HR  Pt received existing BR and agreeable to mobility. Pt c/o L ankle pain during ambulation. Pt was returned to EOB with all needs met.   Franki Monte  Mobility Specialist Please contact via Solicitor or Rehab office at [phone]

## 2022-07-12 NOTE — Progress Notes (Signed)
Rounding Note    Patient Name: Gerald Crosby Date of Encounter: 07/12/2022   [facility] Cardiologist: Burt Knack   Subjective   81 year old gentleman with a history of aortic stenosis, hypertension, hyperlipidemia.  He is eventually admitted with a syncopal episode.  This was likely due to critical aortic stenosis.  He has normal left ventricular systolic function.  Heart catheterization shows minimal coronary artery disease.  He is scheduled for TAVR on 07/15/22    Inpatient Medications    Scheduled Meds:  aspirin EC  81 mg Oral Daily   heparin injection (subcutaneous)  5,000 Units Subcutaneous Q8H   insulin aspart  0-15 Units Subcutaneous TID WC   insulin aspart  0-5 Units Subcutaneous QHS   insulin glargine-yfgn  10 Units Subcutaneous QHS   pravastatin  40 mg Oral q1800   sodium chloride flush  3 mL Intravenous Q12H   sodium chloride flush  3 mL Intravenous Q12H   Continuous Infusions:  sodium chloride     sodium chloride     PRN Meds: sodium chloride, sodium chloride, acetaminophen, diclofenac Sodium, ondansetron **OR** ondansetron (ZOFRAN) IV, sodium chloride flush, sodium chloride flush   Vital Signs    Vitals:   07/11/22 2015 07/12/22 0445 07/12/22 0500 07/12/22 0736  BP: (!) 112/54 (!) 118/43  (!) 108/49  Pulse: (!) 59 65  65  Resp: '18 17  16  '$ Temp: 98.3 F (36.8 C) 98.1 F (36.7 C)  98 F (36.7 C)  TempSrc: Oral Oral  Oral  SpO2: 91% 95%  92%  Weight: 111.2 kg  111.2 kg   Height:       No intake or output data in the 24 hours ending 07/12/22 0836    07/12/2022    5:00 AM 07/11/2022    8:15 PM 07/11/2022    4:12 AM  Last 3 Weights  Weight (lbs) 245 lb 2.4 oz 245 lb 2.4 oz 248 lb 3.2 oz  Weight (kg) 111.2 kg 111.2 kg 112.583 kg      Telemetry    NSR  - Personally Reviewed  ECG     - Personally Reviewed  Physical Exam    GEN: moderately obese , pleasant , NAD    Neck: No JVD Cardiac: RRR, 3/4/ systolic murmur   Respiratory: Clear to auscultation bilaterally. GI: Soft, nontender, non-distended  MS: No edema; No deformity. Neuro:  Nonfocal  Psych: Normal affect   Labs    High Sensitivity Troponin:   Recent Labs  Lab 07/07/22 2204 07/08/22 0025 07/08/22 0207 07/08/22 0343 07/08/22 0650  TROPONINIHS 376* 439* 467* 461* 495*     Chemistry Recent Labs  Lab 07/07/22 2000 07/08/22 0207 07/09/22 0424 07/09/22 1159 07/09/22 1207 07/10/22 0222 07/11/22 0145  NA 136 136 138   < > 140 138 134*  K 4.2 4.1 4.1   < > 4.2 4.1 4.0  CL 102 105 104  --   --  105 100  CO2 '27 27 28  '$ --   --  27 26  GLUCOSE 247* 220* 132*  --   --  128* 134*  BUN '17 15 15  '$ --   --  14 15  CREATININE 1.52* 1.39* 1.47*  --   --  1.66* 1.65*  CALCIUM 9.5 9.4 9.6  --   --  9.7 9.7  MG 2.0 1.9  --   --   --   --   --   PROT 7.2 7.0  --   --   --   --   --  ALBUMIN 3.9 3.9  --   --   --   --   --   AST 23 21  --   --   --   --   --   ALT 17 17  --   --   --   --   --   ALKPHOS 82 76  --   --   --   --   --   BILITOT 0.8 0.9  --   --   --   --   --   GFRNONAA 45* 50* 47*  --   --  41* 41*  ANIONGAP 7 4* 6  --   --  6 8   < > = values in this interval not displayed.    Lipids  Recent Labs  Lab 07/09/22 0424  CHOL 135  TRIG 48  HDL 45  LDLCALC 80  CHOLHDL 3.0    Hematology Recent Labs  Lab 07/08/22 0207 07/09/22 0424 07/09/22 1159 07/09/22 1200 07/09/22 1207 07/10/22 0222  WBC 7.0 6.0  --   --   --  6.0  RBC 3.73* 3.64*  --   --   --  3.68*  HGB 12.3* 11.9*   < > 12.2* 12.2* 12.1*  HCT 36.9* 36.4*   < > 36.0* 36.0* 36.8*  MCV 98.9 100.0  --   --   --  100.0  MCH 33.0 32.7  --   --   --  32.9  MCHC 33.3 32.7  --   --   --  32.9  RDW 12.2 12.3  --   --   --  12.0  PLT 136* 127*  --   --   --  132*   < > = values in this interval not displayed.   Thyroid No results for input(s): "TSH", "FREET4" in the last 168 hours.  BNP Recent Labs  Lab 07/07/22 2000  BNP 146.0*    DDimer  Recent Labs   Lab 07/08/22 0650  DDIMER 0.52*     Radiology    CT CORONARY MORPH W/CTA COR W/SCORE W/CA W/CM &/OR WO/CM  Addendum Date: 07/11/2022   ADDENDUM REPORT: 07/11/2022 08:37 EXAM: OVER-READ INTERPRETATION  CT CHEST The following report is an over-read performed by radiologist Dr. Maudry Diego [facility] on 07/11/2022. This over-read does not include interpretation of cardiac or coronary anatomy or pathology. The cardiac TAVR interpretation by the cardiologist is attached. COMPARISON:  None. FINDINGS: Extracardiac findings will be described separately under dictation for contemporaneously obtained CTA chest, abdomen and pelvis. IMPRESSION: Please see separate dictation for contemporaneously obtained CTA chest, abdomen and pelvis dated 07/11/2022 for full description of relevant extracardiac findings. Electronically Signed   By: Yetta Glassman M.D.   On: 07/11/2022 08:37   Result Date: 07/11/2022 CLINICAL DATA:  Aortic Stenosis EXAM: Cardiac TAVR CT TECHNIQUE: The patient was scanned on a Siemens Force 161 slice scanner. A 120 kV retrospective scan was triggered in the ascending thoracic aorta at 140 HU's. Gantry rotation speed was 250 msecs and collimation was .6 mm. No beta blockade or nitro were given. The 3D data set was reconstructed in 5% intervals of the R-R cycle. Systolic and diastolic phases were analyzed on a dedicated work station using MPR, MIP and VRT modes. The patient received 80 cc of contrast. FINDINGS: Aortic Valve: Tri leaflet calcified with score  3631 Aorta: Normal arch vessels no aneurysm moderate calcific atherosclerosis Sino-tubular Junction: 27.4 mm Ascending Thoracic Aorta: 33  mm Aortic Arch: 24 mm Descending Thoracic Aorta: 24 mm Sinus of Valsalva Measurements: Non-coronary: 30.7 mm Right - coronary: 28 mm Left -   coronary: 30.7 mm Coronary Artery Height above Annulus: Left Main: 10 mm above annulus Right Coronary: 12.1 mm above annulus Virtual Basal Annulus  Measurements: Maximum / Minimum Diameter: 23.7 mm x 22 mm Perimeter: 79.8 mm Area: 459 mm2 Coronary Arteries: Sufficient height above annulus for deployment LM a bit shallow Optimum Fluoroscopic Angle for Delivery: LAO 18 Caudal 11 degrees IMPRESSION: 1. Calcified tri leaflet AV with score 3631 2.  Annular area of 459 mm2 suitable for a 26 mm Sapien 3 valve 3. Optimum angiographic angle for deployment LAO 18 Caudal 11 degrees 4.  Coronary arteries sufficient height for deployment LM - 10 mm 5.  Membranous septal length 7.4 mm Jenkins Rouge Electronically Signed: By: Jenkins Rouge M.D. On: 07/10/2022 16:39   CT ANGIO ABDOMEN PELVIS  W &/OR WO CONTRAST  Result Date: 07/11/2022 CLINICAL DATA:  Preop evaluation for aortic valve replacement EXAM: CT ANGIOGRAPHY CHEST, ABDOMEN AND PELVIS TECHNIQUE: Non-contrast CT of the chest was initially obtained. Multidetector CT imaging through the chest, abdomen and pelvis was performed using the standard protocol during bolus administration of intravenous contrast. Multiplanar reconstructed images and MIPs were obtained and reviewed to evaluate the vascular anatomy. RADIATION DOSE REDUCTION: This exam was performed according to the departmental dose-optimization program which includes automated exposure control, adjustment of the mA and/or kV according to patient size and/or use of iterative reconstruction technique. CONTRAST:  115m OMNIPAQUE IOHEXOL 350 MG/ML SOLN COMPARISON:  None Available. FINDINGS: CTA CHEST FINDINGS Cardiovascular: Cardiomegaly. Aortic valve thickening and calcifications. Normal caliber thoracic aorta with moderate atherosclerotic disease. Standard three-vessel aortic arch with no significant stenosis. Left main lad and RCA coronary artery calcifications. Mediastinum/Nodes: Esophagus and thyroid are unremarkable. No pathologically enlarged lymph nodes seen in the chest. Lungs/Pleura: Central airways are patent. Bibasilar atelectasis. No consolidation,  pleural effusion or pneumothorax. Musculoskeletal: No chest wall abnormality. No acute or significant osseous findings. CTA ABDOMEN AND PELVIS FINDINGS Hepatobiliary: No focal liver abnormality is seen. No gallstones, gallbladder wall thickening, or biliary dilatation. Pancreas: Unremarkable. No pancreatic ductal dilatation or surrounding inflammatory changes. Spleen: Normal in size without focal abnormality. Adrenals/Urinary Tract: Bilateral adrenal glands are unremarkable. No hydronephrosis or nephrolithiasis. Bilateral low-attenuation renal lesions, largest are compatible with simple cysts, others are too small to completely characterize, no specific follow-up imaging is recommended. Bladder is unremarkable. Stomach/Bowel: Stomach is within normal limits. Appendix appears normal. No evidence of bowel wall thickening, distention, or inflammatory changes. Vascular/lymphatic: Normal caliber abdominal aorta with moderate atherosclerotic disease. No significant stenosis. No pathologically enlarged lymph nodes in the abdomen or pelvis. Reproductive: Mild median lobe hypertrophy. Other: No abdominal wall hernia or abnormality. Trace abdominal ascites. Musculoskeletal: No acute or significant osseous findings. VASCULAR MEASUREMENTS PERTINENT TO TAVR: AORTA: Minimal Aortic Diameter-13.3 mm Severity of Aortic Calcification-moderate RIGHT PELVIS: Right Common Iliac Artery - Minimal Diameter-6.9 mm Tortuosity-mild Calcification-moderate Right External Iliac Artery - Minimal Diameter-7.7 mm Tortuosity-moderate Calcification-mild Right Common Femoral Artery - Minimal Diameter-6.0 mm Tortuosity-mild Calcification-mild LEFT PELVIS: Left Common Iliac Artery - Minimal Diameter-6.9 mm Tortuosity-none Calcification-moderate Left External Iliac Artery - Minimal Diameter-8.3 mm Tortuosity-moderate Calcification-none Left Common Femoral Artery - Minimal Diameter-7.7 mm Tortuosity-mild Calcification-mild Review of the MIP images  confirms the above findings. IMPRESSION: 1. Vascular findings and measurements pertinent to potential TAVR procedure, as detailed above. 2. Thickening and calcification of the aortic valve, compatible with reported  clinical history of aortic stenosis. 3. Moderate aortoiliac atherosclerosis. Left main lad and RCA coronary artery calcifications. Electronically Signed   By: Yetta Glassman M.D.   On: 07/11/2022 08:36   CT ANGIO CHEST AORTA W/CM & OR WO/CM  Result Date: 07/11/2022 CLINICAL DATA:  Preop evaluation for aortic valve replacement EXAM: CT ANGIOGRAPHY CHEST, ABDOMEN AND PELVIS TECHNIQUE: Non-contrast CT of the chest was initially obtained. Multidetector CT imaging through the chest, abdomen and pelvis was performed using the standard protocol during bolus administration of intravenous contrast. Multiplanar reconstructed images and MIPs were obtained and reviewed to evaluate the vascular anatomy. RADIATION DOSE REDUCTION: This exam was performed according to the departmental dose-optimization program which includes automated exposure control, adjustment of the mA and/or kV according to patient size and/or use of iterative reconstruction technique. CONTRAST:  138m OMNIPAQUE IOHEXOL 350 MG/ML SOLN COMPARISON:  None Available. FINDINGS: CTA CHEST FINDINGS Cardiovascular: Cardiomegaly. Aortic valve thickening and calcifications. Normal caliber thoracic aorta with moderate atherosclerotic disease. Standard three-vessel aortic arch with no significant stenosis. Left main lad and RCA coronary artery calcifications. Mediastinum/Nodes: Esophagus and thyroid are unremarkable. No pathologically enlarged lymph nodes seen in the chest. Lungs/Pleura: Central airways are patent. Bibasilar atelectasis. No consolidation, pleural effusion or pneumothorax. Musculoskeletal: No chest wall abnormality. No acute or significant osseous findings. CTA ABDOMEN AND PELVIS FINDINGS Hepatobiliary: No focal liver abnormality is seen.  No gallstones, gallbladder wall thickening, or biliary dilatation. Pancreas: Unremarkable. No pancreatic ductal dilatation or surrounding inflammatory changes. Spleen: Normal in size without focal abnormality. Adrenals/Urinary Tract: Bilateral adrenal glands are unremarkable. No hydronephrosis or nephrolithiasis. Bilateral low-attenuation renal lesions, largest are compatible with simple cysts, others are too small to completely characterize, no specific follow-up imaging is recommended. Bladder is unremarkable. Stomach/Bowel: Stomach is within normal limits. Appendix appears normal. No evidence of bowel wall thickening, distention, or inflammatory changes. Vascular/lymphatic: Normal caliber abdominal aorta with moderate atherosclerotic disease. No significant stenosis. No pathologically enlarged lymph nodes in the abdomen or pelvis. Reproductive: Mild median lobe hypertrophy. Other: No abdominal wall hernia or abnormality. Trace abdominal ascites. Musculoskeletal: No acute or significant osseous findings. VASCULAR MEASUREMENTS PERTINENT TO TAVR: AORTA: Minimal Aortic Diameter-13.3 mm Severity of Aortic Calcification-moderate RIGHT PELVIS: Right Common Iliac Artery - Minimal Diameter-6.9 mm Tortuosity-mild Calcification-moderate Right External Iliac Artery - Minimal Diameter-7.7 mm Tortuosity-moderate Calcification-mild Right Common Femoral Artery - Minimal Diameter-6.0 mm Tortuosity-mild Calcification-mild LEFT PELVIS: Left Common Iliac Artery - Minimal Diameter-6.9 mm Tortuosity-none Calcification-moderate Left External Iliac Artery - Minimal Diameter-8.3 mm Tortuosity-moderate Calcification-none Left Common Femoral Artery - Minimal Diameter-7.7 mm Tortuosity-mild Calcification-mild Review of the MIP images confirms the above findings. IMPRESSION: 1. Vascular findings and measurements pertinent to potential TAVR procedure, as detailed above. 2. Thickening and calcification of the aortic valve, compatible with  reported clinical history of aortic stenosis. 3. Moderate aortoiliac atherosclerosis. Left main lad and RCA coronary artery calcifications. Electronically Signed   By: LYetta GlassmanM.D.   On: 07/11/2022 08:36    Cardiac Studies      Patient Profile     81y.o. male    Assessment & Plan    1.  Severe aortic stenosis: He is scheduled for TAVR on Tuesday.  He has minimal coronary artery disease.  2.  Moderate  obesity: Advised weight loss once he gets out of the hospital.  3 prostate cancer: Treated with radiation therapy.  For questions or updates, please contact [email or web address] consult [email or web address] for contact info under  Signed, Mertie Moores, MD  07/12/2022, 8:36 AM

## 2022-07-13 DIAGNOSIS — I350 Nonrheumatic aortic (valve) stenosis: Secondary | ICD-10-CM | POA: Diagnosis not present

## 2022-07-13 LAB — GLUCOSE, CAPILLARY
Glucose-Capillary: 196 mg/dL — ABNORMAL HIGH (ref 70–99)
Glucose-Capillary: 128 mg/dL — ABNORMAL HIGH (ref 70–99)
Glucose-Capillary: 131 mg/dL — ABNORMAL HIGH (ref 70–99)
Glucose-Capillary: 158 mg/dL — ABNORMAL HIGH (ref 70–99)

## 2022-07-13 NOTE — Progress Notes (Signed)
Rounding Note    Patient Name: Gerald Crosby Date of Encounter: 07/13/2022  [facility] Cardiologist: Burt Knack   Subjective   81 year old gentleman with a history of aortic stenosis, hypertension, hyperlipidemia.  He is eventually admitted with a syncopal episode.  This was likely due to critical aortic stenosis.  He has normal left ventricular systolic function.  Heart catheterization shows minimal coronary artery disease.  He is scheduled for TAVR on Tuesday ,  07/15/22    Inpatient Medications    Scheduled Meds:  aspirin EC  81 mg Oral Daily   heparin injection (subcutaneous)  5,000 Units Subcutaneous Q8H   insulin aspart  0-15 Units Subcutaneous TID WC   insulin aspart  0-5 Units Subcutaneous QHS   insulin glargine-yfgn  10 Units Subcutaneous QHS   pravastatin  40 mg Oral q1800   sodium chloride flush  3 mL Intravenous Q12H   sodium chloride flush  3 mL Intravenous Q12H   Continuous Infusions:  sodium chloride     sodium chloride     PRN Meds: sodium chloride, sodium chloride, acetaminophen, diclofenac Sodium, ondansetron **OR** ondansetron (ZOFRAN) IV, sodium chloride flush, sodium chloride flush   Vital Signs    Vitals:   07/12/22 0736 07/12/22 0945 07/12/22 2148 07/13/22 0610  BP: (!) 108/49 (!) 108/55 (!) 119/48 120/60  Pulse: 65 65 63 66  Resp: '16 17 18 20  '$ Temp: 98 F (36.7 C) 97.8 F (36.6 C) 98 F (36.7 C) 98.4 F (36.9 C)  TempSrc: Oral  Oral Oral  SpO2: 92% 94% 97%   Weight:    110.9 kg  Height:        Intake/Output Summary (Last 24 hours) at 07/13/2022 1338 Last data filed at 07/12/2022 2251 Gross per 24 hour  Intake 123 ml  Output --  Net 123 ml      07/13/2022    6:10 AM 07/12/2022    5:00 AM 07/11/2022    8:15 PM  Last 3 Weights  Weight (lbs) 244 lb 6.4 oz 245 lb 2.4 oz 245 lb 2.4 oz  Weight (kg) 110.859 kg 111.2 kg 111.2 kg      Telemetry     NSR  - Personally Reviewed  ECG     - Personally  Reviewed  Physical Exam   Physical Exam: Blood pressure 120/60, pulse 66, temperature 98.4 F (36.9 C), temperature source Oral, resp. rate 20, height '5\' 9"'$  (1.753 m), weight 110.9 kg, SpO2 97 %.       GEN:  elderly male  in no acute distress HEENT: Normal NECK: No JVD; No carotid bruits LYMPHATICS: No lymphadenopathy CARDIAC: RRR , 1-3/0 systolic murmur  RESPIRATORY:  Clear to auscultation without rales, wheezing or rhonchi  ABDOMEN: Soft, non-tender, non-distended MUSCULOSKELETAL:  No edema; No deformity  SKIN: Warm and dry NEUROLOGIC:  Alert and oriented x 3   Labs    High Sensitivity Troponin:   Recent Labs  Lab 07/07/22 2204 07/08/22 0025 07/08/22 0207 07/08/22 0343 07/08/22 0650  TROPONINIHS 376* 439* 467* 461* 495*      Chemistry Recent Labs  Lab 07/07/22 2000 07/08/22 0207 07/09/22 0424 07/09/22 1159 07/09/22 1207 07/10/22 0222 07/11/22 0145  NA 136 136 138   < > 140 138 134*  K 4.2 4.1 4.1   < > 4.2 4.1 4.0  CL 102 105 104  --   --  105 100  CO2 '27 27 28  '$ --   --  27 26  GLUCOSE 247*  220* 132*  --   --  128* 134*  BUN '17 15 15  '$ --   --  14 15  CREATININE 1.52* 1.39* 1.47*  --   --  1.66* 1.65*  CALCIUM 9.5 9.4 9.6  --   --  9.7 9.7  MG 2.0 1.9  --   --   --   --   --   PROT 7.2 7.0  --   --   --   --   --   ALBUMIN 3.9 3.9  --   --   --   --   --   AST 23 21  --   --   --   --   --   ALT 17 17  --   --   --   --   --   ALKPHOS 82 76  --   --   --   --   --   BILITOT 0.8 0.9  --   --   --   --   --   GFRNONAA 45* 50* 47*  --   --  41* 41*  ANIONGAP 7 4* 6  --   --  6 8   < > = values in this interval not displayed.     Lipids  Recent Labs  Lab 07/09/22 0424  CHOL 135  TRIG 48  HDL 45  LDLCALC 80  CHOLHDL 3.0     Hematology Recent Labs  Lab 07/08/22 0207 07/09/22 0424 07/09/22 1159 07/09/22 1200 07/09/22 1207 07/10/22 0222  WBC 7.0 6.0  --   --   --  6.0  RBC 3.73* 3.64*  --   --   --  3.68*  HGB 12.3* 11.9*   < > 12.2*  12.2* 12.1*  HCT 36.9* 36.4*   < > 36.0* 36.0* 36.8*  MCV 98.9 100.0  --   --   --  100.0  MCH 33.0 32.7  --   --   --  32.9  MCHC 33.3 32.7  --   --   --  32.9  RDW 12.2 12.3  --   --   --  12.0  PLT 136* 127*  --   --   --  132*   < > = values in this interval not displayed.    Thyroid No results for input(s): "TSH", "FREET4" in the last 168 hours.  BNP Recent Labs  Lab 07/07/22 2000  BNP 146.0*     DDimer  Recent Labs  Lab 07/08/22 0650  DDIMER 0.52*      Radiology    No results found.  Cardiac Studies      Patient Profile     81 y.o. male    Assessment & Plan    1.  Severe aortic stenosis: scheduled for TAVR on Tuesday   2.  Moderate  obesity:   will need some weight loss   3 prostate cancer:  s/p XRT   For questions or updates, please contact [place] Please consult [email or web address] for contact info under        Signed, Mertie Moores, MD  07/13/2022, 1:38 PM

## 2022-07-13 NOTE — Progress Notes (Signed)
Mobility Specialist - Progress Note   07/13/22 0953  Mobility  Activity Ambulated independently in hallway  Level of Assistance Independent  Assistive Device None  Distance Ambulated (ft) 260 ft  Activity Response Tolerated well  Mobility Referral Yes  $Mobility charge 1 Mobility   Pt received EOB and agreeable to mobility. No complaints throughout. Pt returned to bed with all needs met.   Franki Monte  Mobility Specialist Please contact via Solicitor or Rehab office at [phone]

## 2022-07-14 DIAGNOSIS — I350 Nonrheumatic aortic (valve) stenosis: Secondary | ICD-10-CM

## 2022-07-14 LAB — GLUCOSE, CAPILLARY
Glucose-Capillary: 195 mg/dL — ABNORMAL HIGH (ref 70–99)
Glucose-Capillary: 167 mg/dL — ABNORMAL HIGH (ref 70–99)
Glucose-Capillary: 96 mg/dL (ref 70–99)
Glucose-Capillary: 140 mg/dL — ABNORMAL HIGH (ref 70–99)
Glucose-Capillary: 143 mg/dL — ABNORMAL HIGH (ref 70–99)

## 2022-07-14 LAB — PROTIME-INR
INR: 1 (ref 0.8–1.2)
Prothrombin Time: 13.5 seconds (ref 11.4–15.2)

## 2022-07-14 LAB — ABO/RH: ABO/RH(D): O POS

## 2022-07-14 MED ORDER — CEFAZOLIN SODIUM-DEXTROSE 2-4 GM/100ML-% IV SOLN
2.0000 g | INTRAVENOUS | Status: AC
  Administered 2022-07-15: 2 g via INTRAVENOUS
  Filled 2022-07-14 (×2): qty 100

## 2022-07-14 MED ORDER — DEXMEDETOMIDINE HCL IN NACL 400 MCG/100ML IV SOLN
0.1000 ug/kg/h | INTRAVENOUS | Status: AC
  Administered 2022-07-15: 1 ug/kg/h via INTRAVENOUS
  Filled 2022-07-14: qty 100

## 2022-07-14 MED ORDER — POTASSIUM CHLORIDE 2 MEQ/ML IV SOLN
80.0000 meq | INTRAVENOUS | Status: DC
  Filled 2022-07-14: qty 40

## 2022-07-14 MED ORDER — CHLORHEXIDINE GLUCONATE 0.12 % MT SOLN
15.0000 mL | Freq: Once | OROMUCOSAL | Status: AC
  Administered 2022-07-15: 15 mL via OROMUCOSAL
  Filled 2022-07-14: qty 15

## 2022-07-14 MED ORDER — MAGNESIUM SULFATE 50 % IJ SOLN
40.0000 meq | INTRAMUSCULAR | Status: DC
  Filled 2022-07-14: qty 9.85

## 2022-07-14 MED ORDER — CHLORHEXIDINE GLUCONATE 4 % EX LIQD
1.0000 | Freq: Once | CUTANEOUS | Status: AC
  Administered 2022-07-15: 1 via TOPICAL
  Filled 2022-07-14 (×2): qty 15

## 2022-07-14 MED ORDER — NOREPINEPHRINE 4 MG/250ML-% IV SOLN
0.0000 ug/min | INTRAVENOUS | Status: AC
  Administered 2022-07-15: 1 ug/min via INTRAVENOUS
  Filled 2022-07-14: qty 250

## 2022-07-14 MED ORDER — BISACODYL 5 MG PO TBEC
5.0000 mg | DELAYED_RELEASE_TABLET | Freq: Once | ORAL | Status: AC
  Administered 2022-07-14: 5 mg via ORAL
  Filled 2022-07-14: qty 1

## 2022-07-14 MED ORDER — TEMAZEPAM 7.5 MG PO CAPS
15.0000 mg | ORAL_CAPSULE | Freq: Once | ORAL | Status: AC | PRN
  Administered 2022-07-14: 15 mg via ORAL
  Filled 2022-07-14: qty 2

## 2022-07-14 MED ORDER — HEPARIN 30,000 UNITS/1000 ML (OHS) CELLSAVER SOLUTION
Status: DC
  Filled 2022-07-14: qty 1000

## 2022-07-14 NOTE — Progress Notes (Signed)
Mobility Specialist - Progress Note   07/14/22 1039  Mobility  Activity Ambulated with assistance in hallway  Level of Assistance Standby assist, set-up cues, supervision of patient - no hands on  Assistive Device None  Distance Ambulated (ft) 260 ft  Activity Response Tolerated well  Mobility Referral Yes  $Mobility charge 1 Mobility   Pt received in chair and agreeable to mobility. No complaints throughout. Pt was returned to chair with all needs met.  Franki Monte  Mobility Specialist Please contact via Solicitor or Rehab office at [phone]

## 2022-07-14 NOTE — H&P (View-Only) (Signed)
5 Days Post-Op Procedure(s) (LRB): RIGHT HEART CATH AND CORONARY ANGIOGRAPHY (N/A) Subjective: Feels well. Has been ambulating without shortness of breath, dizziness or chest pain.  Objective: Vital signs in last 24 hours: Temp:  [97.3 F (36.3 C)-98.5 F (36.9 C)] 97.9 F (36.6 C) (11/13 1400) Pulse Rate:  [65-74] 66 (11/13 1400) Cardiac Rhythm: Normal sinus rhythm (11/13 0706) Resp:  [18-20] 18 (11/13 1400) BP: (118-126)/(50-76) 118/52 (11/13 1400) SpO2:  [95 %-98 %] 96 % (11/13 1400) Weight:  [110.3 kg] 110.3 kg (11/13 0545)  Hemodynamic parameters for last 24 hours:    Intake/Output from previous day: No intake/output data recorded. Intake/Output this shift: No intake/output data recorded.  General appearance: alert and cooperative Heart: regular rate and rhythm and 3/6 systolic murmur Lungs: clear to auscultation bilaterally  Lab Results: No results for input(s): "WBC", "HGB", "HCT", "PLT" in the last 72 hours. BMET: No results for input(s): "NA", "K", "CL", "CO2", "GLUCOSE", "BUN", "CREATININE", "CALCIUM" in the last 72 hours.  PT/INR:  Recent Labs    07/14/22 1233  LABPROT 13.5  INR 1.0   ABG    Component Value Date/Time   PHART 7.334 (L) 07/09/2022 1207   HCO3 26.2 07/09/2022 1207   TCO2 28 07/09/2022 1207   O2SAT 96 07/09/2022 1207   CBG (last 3)  Recent Labs    07/13/22 2103 07/14/22 0824 07/14/22 1223  GLUCAP 158* 195* 167*    Assessment/Plan:  Severe aortic stenosis. Plan TAVR tomorrow. The patient has been advised of a variety of complications that might develop including but not limited to risks of death, stroke, paravalvular leak, aortic dissection or other major vascular complications, aortic annulus rupture, device embolization, cardiac rupture or perforation, mitral regurgitation, acute myocardial infarction, arrhythmia, heart block or bradycardia requiring permanent pacemaker placement, congestive heart failure, respiratory failure, renal  failure, pneumonia, infection, other late complications related to structural valve deterioration or migration, or other complications that might ultimately cause a temporary or permanent loss of functional independence or other long term morbidity. The patient provides full informed consent for the procedure as described and all questions were answered.      LOS: 5 days    Gaye Pollack 07/14/2022

## 2022-07-14 NOTE — Progress Notes (Signed)
5 Days Post-Op Procedure(s) (LRB): RIGHT HEART CATH AND CORONARY ANGIOGRAPHY (N/A) Subjective: Feels well. Has been ambulating without shortness of breath, dizziness or chest pain.  Objective: Vital signs in last 24 hours: Temp:  [97.3 F (36.3 C)-98.5 F (36.9 C)] 97.9 F (36.6 C) (11/13 1400) Pulse Rate:  [65-74] 66 (11/13 1400) Cardiac Rhythm: Normal sinus rhythm (11/13 0706) Resp:  [18-20] 18 (11/13 1400) BP: (118-126)/(50-76) 118/52 (11/13 1400) SpO2:  [95 %-98 %] 96 % (11/13 1400) Weight:  [110.3 kg] 110.3 kg (11/13 0545)  Hemodynamic parameters for last 24 hours:    Intake/Output from previous day: No intake/output data recorded. Intake/Output this shift: No intake/output data recorded.  General appearance: alert and cooperative Heart: regular rate and rhythm and 3/6 systolic murmur Lungs: clear to auscultation bilaterally  Lab Results: No results for input(s): "WBC", "HGB", "HCT", "PLT" in the last 72 hours. BMET: No results for input(s): "NA", "K", "CL", "CO2", "GLUCOSE", "BUN", "CREATININE", "CALCIUM" in the last 72 hours.  PT/INR:  Recent Labs    07/14/22 1233  LABPROT 13.5  INR 1.0   ABG    Component Value Date/Time   PHART 7.334 (L) 07/09/2022 1207   HCO3 26.2 07/09/2022 1207   TCO2 28 07/09/2022 1207   O2SAT 96 07/09/2022 1207   CBG (last 3)  Recent Labs    07/13/22 2103 07/14/22 0824 07/14/22 1223  GLUCAP 158* 195* 167*    Assessment/Plan:  Severe aortic stenosis. Plan TAVR tomorrow. The patient has been advised of a variety of complications that might develop including but not limited to risks of death, stroke, paravalvular leak, aortic dissection or other major vascular complications, aortic annulus rupture, device embolization, cardiac rupture or perforation, mitral regurgitation, acute myocardial infarction, arrhythmia, heart block or bradycardia requiring permanent pacemaker placement, congestive heart failure, respiratory failure, renal  failure, pneumonia, infection, other late complications related to structural valve deterioration or migration, or other complications that might ultimately cause a temporary or permanent loss of functional independence or other long term morbidity. The patient provides full informed consent for the procedure as described and all questions were answered.      LOS: 5 days    Gaye Pollack 07/14/2022

## 2022-07-14 NOTE — TOC Initial Note (Signed)
Transition of Care [facility]) - Initial/Assessment Note    Patient Details  Name: Gerald Crosby MRN: [id number] Date of Birth: 1941-06-01  Transition of Care [facility] CM/SW Contact:    Bethena Roys, RN Phone Number: 07/14/2022, 1:56 PM  Clinical Narrative: Patient presented for a syncopal episode-plan for TAVR on 07-15-22. PTA patient was independent from home with spouse. Patient states he has DME: cane in the home. Case Manager will continue to follow for transition of care needs as he progresses.                Expected Discharge Plan: Home/Self Care Barriers to Discharge: Continued Medical Work up   Patient Goals and CMS Choice Patient states their goals for this hospitalization and ongoing recovery are:: to return home.   Choice offered to / list presented to : NA  Expected Discharge Plan and Services Expected Discharge Plan: Home/Self Care In-house Referral: NA Discharge Planning Services: CM Consult   Living arrangements for the past 2 months: Single Family Home                   DME Agency: NA   Prior Living Arrangements/Services Living arrangements for the past 2 months: Single Family Home Lives with:: Spouse Patient language and need for interpreter reviewed:: Yes        Need for Family Participation in Patient Care: Yes (Comment) Care giver support system in place?: Yes (comment) Current home services: DME (has a cane in the home.) Criminal Activity/Legal Involvement Pertinent to Current Situation/Hospitalization: No - Comment as needed  Activities of Daily Living Home Assistive Devices/Equipment: Cane (specify quad or straight) ADL Screening (condition at time of admission) Patient's cognitive ability adequate to safely complete daily activities?: Yes Is the patient deaf or have difficulty hearing?: No Does the patient have difficulty seeing, even when wearing glasses/contacts?: No Does the patient have difficulty concentrating, remembering, or  making decisions?: No Patient able to express need for assistance with ADLs?: Yes Does the patient have difficulty dressing or bathing?: No Independently performs ADLs?: Yes (appropriate for developmental age) Does the patient have difficulty walking or climbing stairs?: No Weakness of Legs: Right Weakness of Arms/Hands: None  Permission Sought/Granted Permission sought to share information with : Case Manager, Family Supports   Emotional Assessment Appearance:: Appears stated age Attitude/Demeanor/Rapport: Engaged Affect (typically observed): Appropriate Orientation: : Oriented to Situation, Oriented to  Time, Oriented to Place, Oriented to Self Alcohol / Substance Use: Not Applicable Psych Involvement: No (comment)  Admission diagnosis:  Syncope [R55] Syncope, unspecified syncope type [R55] Severe aortic stenosis [I35.0] Patient Active Problem List   Diagnosis Date Noted   Syncope 07/09/2022   Elevated troponin 07/09/2022   Severe aortic stenosis 07/09/2022   Myocardial injury 07/08/2022   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

## 2022-07-14 NOTE — Care Management Important Message (Signed)
Important Message  Patient Details  Name: Gerald Crosby MRN: [id number] Date of Birth: 1941-05-23   Medicare Important Message Given:  Yes     Krishauna Schatzman [address] 07/14/2022, 4:21 PM

## 2022-07-14 NOTE — Progress Notes (Addendum)
Rounding Note    Patient Name: Gerald Crosby Date of Encounter: 07/14/2022  [place] Cardiologist: None  Subjective   Patient denies physical complaints.  He is comfortably sitting on bed eating breakfast.  He denies chest pain, shortness of breath, dizziness or syncope episode since that he is in the hospital.  Patient has been walking in the hallway without syncope episodes, dizziness, lightheadedness or anginal symptoms.  Inpatient Medications    Scheduled Meds:  aspirin EC  81 mg Oral Daily   heparin injection (subcutaneous)  5,000 Units Subcutaneous Q8H   insulin aspart  0-15 Units Subcutaneous TID WC   insulin aspart  0-5 Units Subcutaneous QHS   insulin glargine-yfgn  10 Units Subcutaneous QHS   pravastatin  40 mg Oral q1800   sodium chloride flush  3 mL Intravenous Q12H   Continuous Infusions:  sodium chloride     sodium chloride     PRN Meds: sodium chloride, sodium chloride, acetaminophen, diclofenac Sodium, ondansetron **OR** ondansetron (ZOFRAN) IV, sodium chloride flush   Vital Signs    Vitals:   07/13/22 1644 07/13/22 2107 07/14/22 0545 07/14/22 0830  BP: 120/76 (!) 126/54 (!) 119/50 (!) 122/57  Pulse: 74 69 65 72  Resp: '18 20 18 18  '$ Temp: 98.5 F (36.9 C) 98.4 F (36.9 C) 97.9 F (36.6 C) (!) 97.3 F (36.3 C)  TempSrc: Oral Oral Oral Oral  SpO2: 98% 95% 96% 96%  Weight:   110.3 kg   Height:       No intake or output data in the 24 hours ending 07/14/22 1045    07/14/2022    5:45 AM 07/13/2022    6:10 AM 07/12/2022    5:00 AM  Last 3 Weights  Weight (lbs) 243 lb 1.6 oz 244 lb 6.4 oz 245 lb 2.4 oz  Weight (kg) 110.269 kg 110.859 kg 111.2 kg      Telemetry    Normal sinus rhythm, heart rate between 70-80.- Personally Reviewed  ECG    No EKG done today- Personally Reviewed  Physical Exam   GEN: No acute distress.  Well-nourished, well-developed.  Comfortably communicating with complete sentences. Neck: No  JVD Cardiac: RRR, crescendo-decrescendo murmur noted to right chest wall between first and second intercostal space.  No cyanosis or pedal edema appreciated. Respiratory: Clear to auscultation bilaterally.  No wheeze rales or rhonchi appreciated GI: Soft, nontender, non-distended  MS: No edema; No deformity. Neuro:  Nonfocal  Psych: Normal affect.  Alert and oriented x3.  Labs    High Sensitivity Troponin:   Recent Labs  Lab 07/07/22 2204 07/08/22 0025 07/08/22 0207 07/08/22 0343 07/08/22 0650  TROPONINIHS 376* 439* 467* 461* 495*     Chemistry Recent Labs  Lab 07/07/22 2000 07/08/22 0207 07/09/22 0424 07/09/22 1159 07/09/22 1207 07/10/22 0222 07/11/22 0145  NA 136 136 138   < > 140 138 134*  K 4.2 4.1 4.1   < > 4.2 4.1 4.0  CL 102 105 104  --   --  105 100  CO2 '27 27 28  '$ --   --  27 26  GLUCOSE 247* 220* 132*  --   --  128* 134*  BUN '17 15 15  '$ --   --  14 15  CREATININE 1.52* 1.39* 1.47*  --   --  1.66* 1.65*  CALCIUM 9.5 9.4 9.6  --   --  9.7 9.7  MG 2.0 1.9  --   --   --   --   --  PROT 7.2 7.0  --   --   --   --   --   ALBUMIN 3.9 3.9  --   --   --   --   --   AST 23 21  --   --   --   --   --   ALT 17 17  --   --   --   --   --   ALKPHOS 82 76  --   --   --   --   --   BILITOT 0.8 0.9  --   --   --   --   --   GFRNONAA 45* 50* 47*  --   --  41* 41*  ANIONGAP 7 4* 6  --   --  6 8   < > = values in this interval not displayed.    Lipids  Recent Labs  Lab 07/09/22 0424  CHOL 135  TRIG 48  HDL 45  LDLCALC 80  CHOLHDL 3.0    Hematology Recent Labs  Lab 07/08/22 0207 07/09/22 0424 07/09/22 1159 07/09/22 1200 07/09/22 1207 07/10/22 0222  WBC 7.0 6.0  --   --   --  6.0  RBC 3.73* 3.64*  --   --   --  3.68*  HGB 12.3* 11.9*   < > 12.2* 12.2* 12.1*  HCT 36.9* 36.4*   < > 36.0* 36.0* 36.8*  MCV 98.9 100.0  --   --   --  100.0  MCH 33.0 32.7  --   --   --  32.9  MCHC 33.3 32.7  --   --   --  32.9  RDW 12.2 12.3  --   --   --  12.0  PLT 136* 127*   --   --   --  132*   < > = values in this interval not displayed.   Thyroid No results for input(s): "TSH", "FREET4" in the last 168 hours.  BNP Recent Labs  Lab 07/07/22 2000  BNP 146.0*    DDimer  Recent Labs  Lab 07/08/22 0650  DDIMER 0.52*     Radiology    No results found.  Cardiac Studies   ECHOCARDIOGRAM REPORT   07/08/2022  Patient Name:   ZEFERINO MOUNTS Date of Exam: 07/08/2022  Medical Rec #:  [id number]       Height:       69.0 in  Accession #:    [id number]      Weight:       259.5 lb  Date of Birth:  07/29/1941       BSA:          2.308 m  Patient Age:    81 years        BP:           124/55 mmHg  Patient Gender: M               HR:           71 bpm.  Exam Location:  Inpatient   Procedure: 2D Echo, 3D Echo, Cardiac Doppler and Color Doppler   Indications:    Syncope R55    History:        Patient has no prior history of Echocardiogram  examinations.                 Signs/Symptoms:Syncope; Risk Factors:Former Smoker,  Diabetes and  Dyslipidemia.    Sonographer:    Greer Pickerel  Referring Phys: [id number] OLADAPO ADEFESO     Sonographer Comments: Patient is obese. Image acquisition challenging due  to respiratory motion.  IMPRESSIONS     1. Left ventricular ejection fraction, by estimation, is 60 to 65%. The  left ventricle has normal function. The left ventricle has no regional  wall motion abnormalities. There is severe left ventricular hypertrophy.  Left ventricular diastolic parameters   are indeterminate.   2. Right ventricular systolic function is normal. The right ventricular  size is normal. There is normal pulmonary artery systolic pressure.   3. There is severe calcifcation of the aortic valve. There is evidence of  critical aortic valve stenosis with Vmax 5.7 m/sec, mean pressure gradient  73 mm Hg and aortic valve area of 0.4 cm2 and trivial aortic valve  regurgitation.   4. The mitral valve is grossly normal. Trivial  mitral valve  regurgitation. No evidence of mitral stenosis.   5. The inferior vena cava is dilated in size with <50% respiratory  variability, suggesting right atrial pressure of 15 mmHg.   FINDINGS   Left Ventricle: Left ventricular ejection fraction, by estimation, is 60  to 65%. The left ventricle has normal function. The left ventricle has no  regional wall motion abnormalities. The left ventricular internal cavity  size was normal in size. There is   severe left ventricular hypertrophy. Left ventricular diastolic  parameters are indeterminate.   Right Ventricle: The right ventricular size is normal. No increase in  right ventricular wall thickness. Right ventricular systolic function is  normal. There is normal pulmonary artery systolic pressure. The tricuspid  regurgitant velocity is 1.97 m/s, and   with an assumed right atrial pressure of 8 mmHg, the estimated right  ventricular systolic pressure is 25.3 mmHg.   Left Atrium: Left atrial size was normal in size.   Right Atrium: Right atrial size was normal in size.   Pericardium: There is no evidence of pericardial effusion.   Mitral Valve: The mitral valve is grossly normal. Trivial mitral valve  regurgitation. No evidence of mitral valve stenosis.   Tricuspid Valve: The tricuspid valve is not well visualized. Tricuspid  valve regurgitation is not demonstrated. No evidence of tricuspid  stenosis.   Aortic Valve: There is severe calcifcation of the aortic valve. There is  severe aortic valve annular calcification. Aortic valve regurgitation is  trivial. Severe aortic stenosis is present. Aortic valve mean gradient  measures 59.7 mmHg. Aortic valve peak   gradient measures 107.1 mmHg. Aortic valve area, by VTI measures 0.48  cm.   Pulmonic Valve: The pulmonic valve was not well visualized. Pulmonic valve  regurgitation is not visualized. No evidence of pulmonic stenosis.   Aorta: The aortic root is normal in size and  structure.   Venous: The inferior vena cava is dilated in size with less than 50%  respiratory variability, suggesting right atrial pressure of 15 mmHg.   IAS/Shunts: No atrial level shunt detected by color flow Doppler.     LEFT VENTRICLE  PLAX 2D  LVIDd:         4.60 cm   Diastology  LVIDs:         3.20 cm   LV e' medial:    4.46 cm/s  LV PW:         1.20 cm   LV E/e' medial:  20.9  LV IVS:        0.90 cm  LV e' lateral:   5.22 cm/s  LVOT diam:     2.00 cm   LV E/e' lateral: 17.9  LV SV:         60  LV SV Index:   26  LVOT Area:     3.14 cm                             3D Volume EF:                           3D EF:        57 %                           LV EDV:       242 ml                           LV ESV:       105 ml                           LV SV:        137 ml   RIGHT VENTRICLE  RV S prime:     12.30 cm/s  TAPSE (M-mode): 2.3 cm   LEFT ATRIUM           Index        RIGHT ATRIUM           Index  LA diam:      4.50 cm 1.95 cm/m   RA Area:     11.30 cm  LA Vol (A2C): 68.4 ml 29.64 ml/m  RA Volume:   22.70 ml  9.84 ml/m  LA Vol (A4C): 73.5 ml 31.85 ml/m   AORTIC VALVE  AV Area (Vmax):    0.56 cm  AV Area (Vmean):   0.54 cm  AV Area (VTI):     0.48 cm  AV Vmax:           517.33 cm/s  AV Vmean:          357.667 cm/s  AV VTI:            1.240 m  AV Peak Grad:      107.1 mmHg  AV Mean Grad:      59.7 mmHg  LVOT Vmax:         93.00 cm/s  LVOT Vmean:        61.300 cm/s  LVOT VTI:          0.191 m  LVOT/AV VTI ratio: 0.15    AORTA  Ao Root diam: 3.60 cm  Ao Asc diam:  3.80 cm   MITRAL VALVE                TRICUSPID VALVE  MV Area (PHT): 3.03 cm     TR Peak grad:   15.5 mmHg  MV Decel Time: 250 msec     TR Vmax:        197.00 cm/s  MV E velocity: 93.40 cm/s  MV A velocity: 111.00 cm/s  SHUNTS  MV E/A ratio:  0.84         Systemic VTI:  0.19 m  Systemic Diam: 2.00 cm   Vishnu Priya Mallipeddi  Electronically signed by Lorelee Cover Mallipeddi  Signature Date/Time: 07/08/2022/12:25:11 PM   Cardiac Cath: 07/09/2022    Dist Cx lesion is 10% stenosed.   Mid LAD lesion is 10% stenosed.   No significant coronary obstructive disease with large relatively normal LAD, ramus intermediate, left circumflex, and RCA.  Diagnostic Dominance: Right        Patient Profile     Mr. Newby is an 81 year old African American male with a history of T2DM, BPH, hyperlipidemia, chronic kidney disease, and obesity who presented to ED via EMS on 07/07/22 after multiple syncopal episode. After finding critical AS on echo, he was then transferred to [facility] for structural heart evaluation.     He is scheduled for TAVR on 07/15/22 (Tuesday).    Assessment & Plan    1) Severe AS with syncope:  Patient presented to emergency department with multiple syncope episodes. He is asymptomatic since he is in the hospital. Denies CP, SOB, dizziness or syncope episodes. He is able to walk in the hallway without anginal or syncopal symptoms. Echocardiogram: 11/07: showed preserved LV function at 60-65% with evidence of severe calcifcation of the aortic valve critical aortic stenosis ,aortic valve area of  0.4cm2 and trivial aortic valve regurgitation. Cardiac cath 11/08: No significant coronary obstructive disease.  -Plan for TAVR on [place], 11/14.    2) Kidney disease: BMP on 07/11/22: Cr 1.66 >> 1.65. Base line Cr between 1.4-1.5. Likely prerenal.  -Will continue to monitor. -BMP tomorrow   3) DM2: Sliding scale while inpatient.  -Plan to resume home regimen at d/c.    4) Chronic diastolic HF: BNP slightly elevated. Likely secondary to critical AS. Denies SOB, PND ore orthopnea. No signs of volume overload. Lungs CTA, no pedal edema.  Appears euvolemic today.     For questions or updates, please contact [place] Please consult [email or web address] for contact info under        Signed, Teola Bradley, MD  07/14/2022, 10:45 AM     [id number]  ATTENDING ATTESTATION:  After conducting a review of all available clinical information with the care team, interviewing the patient, and performing a physical exam, I agree with the findings and plan described in this note.   GEN: No acute distress.   HEENT:  MMM, no JVD, no scleral icterus Cardiac: RRR, 3/6 SEM Respiratory: Clear to auscultation bilaterally. GI: Soft, nontender, non-distended  MS: No edema; No deformity. Neuro:  Nonfocal  Vasc:  +2 radial pulses  Patient is doing well today in preparation for TAVR tomorrow.  Denies presyncope, syncope, chest pain, or dyspnea.  I reviewed TTE images, coronary angiography images, and CTA report (unable to access images).  Planning for 26S3 RTF.  No conduction issues.  NPO at MN.  Discussed with patient.  Plan on ASA 81 after procedure.  Consider adding SGLT2 inhibitor and losartan after AS has been treated given DM.    Lenna Sciara, MD Pager [phone]

## 2022-07-15 ENCOUNTER — Other Ambulatory Visit: Payer: Self-pay | Admitting: Physician Assistant

## 2022-07-15 ENCOUNTER — Encounter (HOSPITAL_COMMUNITY)
Admission: EM | Disposition: A | Discharge status: Home / Self Care | Payer: Medicare HMO | Source: Home / Self Care | Attending: Cardiovascular Disease

## 2022-07-15 ENCOUNTER — Inpatient Hospital Stay (HOSPITAL_COMMUNITY): Payer: Medicare HMO

## 2022-07-15 ENCOUNTER — Inpatient Hospital Stay (HOSPITAL_COMMUNITY): Payer: Medicare HMO | Admitting: Anesthesiology

## 2022-07-15 ENCOUNTER — Other Ambulatory Visit: Payer: Self-pay

## 2022-07-15 ENCOUNTER — Ambulatory Visit: Payer: Medicare HMO | Admitting: Orthopaedic Surgery

## 2022-07-15 ENCOUNTER — Encounter (HOSPITAL_COMMUNITY): Payer: Self-pay | Admitting: Cardiovascular Disease

## 2022-07-15 DIAGNOSIS — M1990 Unspecified osteoarthritis, unspecified site: Secondary | ICD-10-CM

## 2022-07-15 DIAGNOSIS — I350 Nonrheumatic aortic (valve) stenosis: Secondary | ICD-10-CM

## 2022-07-15 DIAGNOSIS — Z952 Presence of prosthetic heart valve: Secondary | ICD-10-CM

## 2022-07-15 DIAGNOSIS — E119 Type 2 diabetes mellitus without complications: Secondary | ICD-10-CM | POA: Diagnosis not present

## 2022-07-15 DIAGNOSIS — Z006 Encounter for examination for normal comparison and control in clinical research program: Secondary | ICD-10-CM

## 2022-07-15 DIAGNOSIS — Z1152 Encounter for screening for COVID-19: Secondary | ICD-10-CM | POA: Diagnosis not present

## 2022-07-15 DIAGNOSIS — Z7984 Long term (current) use of oral hypoglycemic drugs: Secondary | ICD-10-CM | POA: Diagnosis not present

## 2022-07-15 DIAGNOSIS — I5033 Acute on chronic diastolic (congestive) heart failure: Secondary | ICD-10-CM | POA: Diagnosis not present

## 2022-07-15 HISTORY — PX: INTRAOPERATIVE TRANSTHORACIC ECHOCARDIOGRAM: SHX6523

## 2022-07-15 HISTORY — PX: TRANSCATHETER AORTIC VALVE REPLACEMENT, TRANSFEMORAL: SHX6400

## 2022-07-15 HISTORY — DX: Presence of prosthetic heart valve: Z95.2

## 2022-07-15 LAB — ECHOCARDIOGRAM LIMITED
AR max vel: 3.01 cm2
AV Area VTI: 3.13 cm2
AV Area mean vel: 3.3 cm2
AV Mean grad: 5 mmHg
AV Peak grad: 8.9 mmHg
Ao pk vel: 1.49 m/s
Height: 69 in
Weight: 3884.8 oz

## 2022-07-15 LAB — GLUCOSE, CAPILLARY
Glucose-Capillary: 122 mg/dL — ABNORMAL HIGH (ref 70–99)
Glucose-Capillary: 127 mg/dL — ABNORMAL HIGH (ref 70–99)
Glucose-Capillary: 103 mg/dL — ABNORMAL HIGH (ref 70–99)

## 2022-07-15 LAB — CBC
HCT: 37.2 % — ABNORMAL LOW (ref 39.0–52.0)
Hemoglobin: 12.3 g/dL — ABNORMAL LOW (ref 13.0–17.0)
MCH: 32.5 pg (ref 26.0–34.0)
MCHC: 33.1 g/dL (ref 30.0–36.0)
MCV: 98.2 fL (ref 80.0–100.0)
Platelets: 143 10*3/uL — ABNORMAL LOW (ref 150–400)
RBC: 3.79 MIL/uL — ABNORMAL LOW (ref 4.22–5.81)
RDW: 11.9 % (ref 11.5–15.5)
WBC: 6.5 10*3/uL (ref 4.0–10.5)
nRBC: 0 % (ref 0.0–0.2)

## 2022-07-15 LAB — POCT I-STAT 7, (LYTES, BLD GAS, ICA,H+H)
Acid-Base Excess: 2 mmol/L (ref 0.0–2.0)
Bicarbonate: 27.1 mmol/L (ref 20.0–28.0)
Calcium, Ion: 1.37 mmol/L (ref 1.15–1.40)
HCT: 39 % (ref 39.0–52.0)
Hemoglobin: 13.3 g/dL (ref 13.0–17.0)
O2 Saturation: 100 %
Potassium: 4.2 mmol/L (ref 3.5–5.1)
Sodium: 136 mmol/L (ref 135–145)
TCO2: 28 mmol/L (ref 22–32)
pCO2 arterial: 45.1 mmHg (ref 32–48)
pH, Arterial: 7.386 (ref 7.35–7.45)
pO2, Arterial: 252 mmHg — ABNORMAL HIGH (ref 83–108)

## 2022-07-15 LAB — POCT I-STAT, CHEM 8
BUN: 23 mg/dL (ref 8–23)
BUN: 25 mg/dL — ABNORMAL HIGH (ref 8–23)
Calcium, Ion: 1.34 mmol/L (ref 1.15–1.40)
Calcium, Ion: 1.38 mmol/L (ref 1.15–1.40)
Chloride: 101 mmol/L (ref 98–111)
Chloride: 100 mmol/L (ref 98–111)
Creatinine, Ser: 1.5 mg/dL — ABNORMAL HIGH (ref 0.61–1.24)
Creatinine, Ser: 1.6 mg/dL — ABNORMAL HIGH (ref 0.61–1.24)
Glucose, Bld: 125 mg/dL — ABNORMAL HIGH (ref 70–99)
Glucose, Bld: 113 mg/dL — ABNORMAL HIGH (ref 70–99)
HCT: 38 % — ABNORMAL LOW (ref 39.0–52.0)
HCT: 37 % — ABNORMAL LOW (ref 39.0–52.0)
Hemoglobin: 12.9 g/dL — ABNORMAL LOW (ref 13.0–17.0)
Hemoglobin: 12.6 g/dL — ABNORMAL LOW (ref 13.0–17.0)
Potassium: 4.2 mmol/L (ref 3.5–5.1)
Potassium: 4.3 mmol/L (ref 3.5–5.1)
Sodium: 136 mmol/L (ref 135–145)
Sodium: 137 mmol/L (ref 135–145)
TCO2: 28 mmol/L (ref 22–32)
TCO2: 27 mmol/L (ref 22–32)

## 2022-07-15 LAB — BASIC METABOLIC PANEL
Anion gap: 6 (ref 5–15)
BUN: 23 mg/dL (ref 8–23)
CO2: 26 mmol/L (ref 22–32)
Calcium: 9.9 mg/dL (ref 8.9–10.3)
Chloride: 103 mmol/L (ref 98–111)
Creatinine, Ser: 1.67 mg/dL — ABNORMAL HIGH (ref 0.61–1.24)
GFR, Estimated: 40 mL/min — ABNORMAL LOW (ref >60)
Glucose, Bld: 171 mg/dL — ABNORMAL HIGH (ref 70–99)
Potassium: 4.2 mmol/L (ref 3.5–5.1)
Sodium: 135 mmol/L (ref 135–145)

## 2022-07-15 LAB — PREPARE RBC (CROSSMATCH)

## 2022-07-15 LAB — SARS CORONAVIRUS 2 BY RT PCR: SARS Coronavirus 2 by RT PCR: NEGATIVE

## 2022-07-15 LAB — SURGICAL PCR SCREEN
MRSA, PCR: NEGATIVE
Staphylococcus aureus: NEGATIVE

## 2022-07-15 SURGERY — IMPLANTATION, AORTIC VALVE, TRANSCATHETER, FEMORAL APPROACH
Anesthesia: Monitor Anesthesia Care | Site: Chest

## 2022-07-15 MED ORDER — ONDANSETRON HCL 4 MG/2ML IJ SOLN: 4.0000 mg | Freq: Four times a day (QID) | INTRAMUSCULAR | Status: DC | PRN

## 2022-07-15 MED ORDER — PROTAMINE SULFATE 10 MG/ML IV SOLN
INTRAVENOUS | Status: DC | PRN
  Administered 2022-07-15: 200 mg via INTRAVENOUS

## 2022-07-15 MED ORDER — ONDANSETRON HCL 4 MG/2ML IJ SOLN
INTRAMUSCULAR | Status: DC | PRN
  Administered 2022-07-15: 4 mg via INTRAVENOUS

## 2022-07-15 MED ORDER — OXYCODONE HCL 5 MG PO TABS: 5.0000 mg | ORAL_TABLET | ORAL | Status: DC | PRN

## 2022-07-15 MED ORDER — LIDOCAINE HCL 1 % IJ SOLN
INTRAMUSCULAR | Status: AC
  Filled 2022-07-15: qty 20

## 2022-07-15 MED ORDER — IODIXANOL 320 MG/ML IV SOLN
INTRAVENOUS | Status: DC | PRN
  Administered 2022-07-15: 100 mL

## 2022-07-15 MED ORDER — HEPARIN 6000 UNIT IRRIGATION SOLUTION
Status: DC | PRN
  Administered 2022-07-15 (×3): 1

## 2022-07-15 MED ORDER — LIDOCAINE HCL 1 % IJ SOLN
INTRAMUSCULAR | Status: DC | PRN
  Administered 2022-07-15: 20 mL via INTRADERMAL

## 2022-07-15 MED ORDER — SODIUM CHLORIDE 0.9 % IV SOLN: INTRAVENOUS | Status: AC

## 2022-07-15 MED ORDER — DEXMEDETOMIDINE HCL IN NACL 80 MCG/20ML IV SOLN
INTRAVENOUS | Status: DC | PRN
  Administered 2022-07-15: 55 ug via BUCCAL

## 2022-07-15 MED ORDER — HEPARIN SODIUM (PORCINE) 1000 UNIT/ML IJ SOLN
INTRAMUSCULAR | Status: AC
  Filled 2022-07-15: qty 30

## 2022-07-15 MED ORDER — PROTAMINE SULFATE 10 MG/ML IV SOLN
INTRAVENOUS | Status: AC
  Filled 2022-07-15: qty 50

## 2022-07-15 MED ORDER — HEPARIN 6000 UNIT IRRIGATION SOLUTION
Status: AC
  Filled 2022-07-15: qty 1500

## 2022-07-15 MED ORDER — DEXAMETHASONE SODIUM PHOSPHATE 10 MG/ML IJ SOLN
INTRAMUSCULAR | Status: DC | PRN
  Administered 2022-07-15: 8 mg via INTRAVENOUS

## 2022-07-15 MED ORDER — MORPHINE SULFATE (PF) 2 MG/ML IV SOLN: 1.0000 mg | INTRAVENOUS | Status: DC | PRN

## 2022-07-15 MED ORDER — CEFAZOLIN SODIUM-DEXTROSE 2-4 GM/100ML-% IV SOLN
2.0000 g | Freq: Three times a day (TID) | INTRAVENOUS | Status: AC
  Administered 2022-07-15 – 2022-07-16 (×2): 2 g via INTRAVENOUS
  Filled 2022-07-15 (×2): qty 100

## 2022-07-15 MED ORDER — CHLORHEXIDINE GLUCONATE 0.12 % MT SOLN
15.0000 mL | Freq: Once | OROMUCOSAL | Status: AC
  Administered 2022-07-15: 15 mL via OROMUCOSAL
  Filled 2022-07-15: qty 15

## 2022-07-15 MED ORDER — SODIUM CHLORIDE 0.9% FLUSH
3.0000 mL | Freq: Two times a day (BID) | INTRAVENOUS | Status: DC
  Administered 2022-07-16: 3 mL via INTRAVENOUS

## 2022-07-15 MED ORDER — ACETAMINOPHEN 650 MG RE SUPP: 650.0000 mg | Freq: Four times a day (QID) | RECTAL | Status: DC | PRN

## 2022-07-15 MED ORDER — 0.9 % SODIUM CHLORIDE (POUR BTL) OPTIME
TOPICAL | Status: DC | PRN
  Administered 2022-07-15: 1000 mL

## 2022-07-15 MED ORDER — ACETAMINOPHEN 325 MG PO TABS: 650.0000 mg | ORAL_TABLET | Freq: Four times a day (QID) | ORAL | Status: DC | PRN

## 2022-07-15 MED ORDER — ACETAMINOPHEN 500 MG PO TABS
1000.0000 mg | ORAL_TABLET | Freq: Once | ORAL | Status: AC
  Administered 2022-07-15: 1000 mg via ORAL
  Filled 2022-07-15: qty 2

## 2022-07-15 MED ORDER — SODIUM CHLORIDE 0.9 % IV SOLN: 250.0000 mL | INTRAVENOUS | Status: DC | PRN

## 2022-07-15 MED ORDER — LACTATED RINGERS IV SOLN: INTRAVENOUS | Status: DC

## 2022-07-15 MED ORDER — SODIUM CHLORIDE 0.9% FLUSH: 3.0000 mL | INTRAVENOUS | Status: DC | PRN

## 2022-07-15 MED ORDER — LACTATED RINGERS IV SOLN: INTRAVENOUS | Status: DC | PRN

## 2022-07-15 MED ORDER — HEPARIN SODIUM (PORCINE) 1000 UNIT/ML IJ SOLN
INTRAMUSCULAR | Status: DC | PRN
  Administered 2022-07-15: 20000 [IU] via INTRAVENOUS

## 2022-07-15 MED ORDER — TRAMADOL HCL 50 MG PO TABS: 50.0000 mg | ORAL_TABLET | ORAL | Status: DC | PRN

## 2022-07-15 MED ORDER — NITROGLYCERIN IN D5W 200-5 MCG/ML-% IV SOLN
0.0000 ug/min | INTRAVENOUS | Status: DC
  Filled 2022-07-15: qty 250

## 2022-07-15 MED ORDER — HEPARIN SODIUM (PORCINE) 1000 UNIT/ML IJ SOLN
INTRAMUSCULAR | Status: AC
  Filled 2022-07-15: qty 1

## 2022-07-15 MED ORDER — ORAL CARE MOUTH RINSE: 15.0000 mL | Freq: Once | OROMUCOSAL | Status: AC

## 2022-07-15 MED ORDER — CLEVIDIPINE BUTYRATE 0.5 MG/ML IV EMUL
INTRAVENOUS | Status: DC | PRN
  Administered 2022-07-15: 4 mg/h via INTRAVENOUS

## 2022-07-15 SURGICAL SUPPLY — 66 items
BAG BANDED W/RUBBER/TAPE 36X54 (MISCELLANEOUS) IMPLANT
BAG COUNTER SPONGE SURGICOUNT (BAG) ×2 IMPLANT
BAG DECANTER FOR FLEXI CONT (MISCELLANEOUS) IMPLANT
BLADE CLIPPER SURG (BLADE) IMPLANT
BLADE STERNUM SYSTEM 6 (BLADE) IMPLANT
CABLE ADAPT CONN TEMP 6FT (ADAPTER) ×2 IMPLANT
CANISTER SUCT 3000ML PPV (MISCELLANEOUS) IMPLANT
CATH DIAG EXPO 6F AL1 (CATHETERS) IMPLANT
CATH DIAG EXPO 6F VENT PIG 145 (CATHETERS) ×4 IMPLANT
CATH INFINITI 6F AL2 (CATHETERS) IMPLANT
CATH S G BIP PACING (CATHETERS) ×2 IMPLANT
CHLORAPREP W/TINT 26 (MISCELLANEOUS) ×2 IMPLANT
CLOSURE MYNX CONTROL 6F/7F (Vascular Products) IMPLANT
CLOSURE PERCLOSE PROSTYLE (VASCULAR PRODUCTS) IMPLANT
CNTNR URN SCR LID CUP LEK RST (MISCELLANEOUS) ×4 IMPLANT
CONT SPEC 4OZ STRL OR WHT (MISCELLANEOUS) ×4
COVER BACK TABLE 80X110 HD (DRAPES) IMPLANT
DERMABOND ADVANCED .7 DNX12 (GAUZE/BANDAGES/DRESSINGS) ×2 IMPLANT
DEVICE CLOSURE PERCLS PRGLD 6F (VASCULAR PRODUCTS) ×4 IMPLANT
DRSG TEGADERM 4X10 (GAUZE/BANDAGES/DRESSINGS) IMPLANT
DRSG TEGADERM 4X4.75 (GAUZE/BANDAGES/DRESSINGS) ×4 IMPLANT
ELECT REM PT RETURN 9FT ADLT (ELECTROSURGICAL) ×2
ELECTRODE REM PT RTRN 9FT ADLT (ELECTROSURGICAL) ×2 IMPLANT
GAUZE 4X4 16PLY ~~LOC~~+RFID DBL (SPONGE) IMPLANT
GAUZE SPONGE 4X4 12PLY STRL (GAUZE/BANDAGES/DRESSINGS) ×2 IMPLANT
GLOVE BIO SURGEON STRL SZ7.5 (GLOVE) ×2 IMPLANT
GLOVE BIO SURGEON STRL SZ8 (GLOVE) ×2 IMPLANT
GLOVE ECLIPSE 7.0 STRL STRAW (GLOVE) ×2 IMPLANT
GOWN STRL REUS W/ TWL LRG LVL3 (GOWN DISPOSABLE) IMPLANT
GOWN STRL REUS W/ TWL XL LVL3 (GOWN DISPOSABLE) ×2 IMPLANT
GOWN STRL REUS W/TWL LRG LVL3 (GOWN DISPOSABLE)
GOWN STRL REUS W/TWL XL LVL3 (GOWN DISPOSABLE) ×10
GUIDEWIRE SAF TJ AMPL .035X180 (WIRE) ×2 IMPLANT
GUIDEWIRE SAFE TJ AMPLATZ EXST (WIRE) ×2 IMPLANT
KIT BASIN OR (CUSTOM PROCEDURE TRAY) ×2 IMPLANT
KIT HEART LEFT (KITS) ×2 IMPLANT
KIT SAPIAN 3 ULTRA RESILIA 26 (Valve) IMPLANT
KIT TURNOVER KIT B (KITS) ×2 IMPLANT
NS IRRIG 1000ML POUR BTL (IV SOLUTION) ×2 IMPLANT
PACK ENDO MINOR (CUSTOM PROCEDURE TRAY) ×2 IMPLANT
PAD ARMBOARD 7.5X6 YLW CONV (MISCELLANEOUS) ×4 IMPLANT
PAD ELECT DEFIB RADIOL ZOLL (MISCELLANEOUS) ×2 IMPLANT
PERCLOSE PROGLIDE 6F (VASCULAR PRODUCTS) ×4
POSITIONER HEAD DONUT 9IN (MISCELLANEOUS) ×2 IMPLANT
SET MICROPUNCTURE 5F STIFF (MISCELLANEOUS) ×2 IMPLANT
SHEATH BRITE TIP 7FR 35CM (SHEATH) ×2 IMPLANT
SHEATH PINNACLE 6F 10CM (SHEATH) ×2 IMPLANT
SHEATH PINNACLE 8F 10CM (SHEATH) ×2 IMPLANT
SLEEVE REPOSITIONING LENGTH 30 (MISCELLANEOUS) ×2 IMPLANT
SPIKE FLUID TRANSFER (MISCELLANEOUS) ×2 IMPLANT
SPONGE T-LAP 18X18 ~~LOC~~+RFID (SPONGE) ×2 IMPLANT
STOPCOCK MORSE 400PSI 3WAY (MISCELLANEOUS) ×4 IMPLANT
SUT SILK  1 MH (SUTURE) ×2
SUT SILK 1 MH (SUTURE) ×2 IMPLANT
SYR 50ML LL SCALE MARK (SYRINGE) ×2 IMPLANT
SYR BULB IRRIG 60ML STRL (SYRINGE) IMPLANT
TOWEL GREEN STERILE (TOWEL DISPOSABLE) ×4 IMPLANT
TRANSDUCER W/STOPCOCK (MISCELLANEOUS) ×4 IMPLANT
TRAY FOLEY SLVR 14FR TEMP STAT (SET/KITS/TRAYS/PACK) IMPLANT
TUBE SUCT INTRACARD DLP 20F (MISCELLANEOUS) IMPLANT
TUBING ART PRESS 72  MALE/FEM (TUBING) ×2
TUBING ART PRESS 72 MALE/FEM (TUBING) IMPLANT
WIRE EMERALD 3MM-J .035X150CM (WIRE) ×2 IMPLANT
WIRE EMERALD 3MM-J .035X260CM (WIRE) ×2 IMPLANT
WIRE EMERALD ST .035X260CM (WIRE) ×4 IMPLANT
WIRE SAFARI SM CURVE 275 (WIRE) ×2 IMPLANT

## 2022-07-15 NOTE — Progress Notes (Signed)
Mobility Specialist Progress Note   07/15/22 1055  Mobility  Activity Ambulated with assistance in hallway  Level of Assistance Independent  Assistive Device None  Distance Ambulated (ft) 270 ft  Range of Motion/Exercises Active;All extremities  Activity Response Tolerated well   Pre Mobility:  HR 74 During Mobility: HR 86 Post Mobility: HR 74  Patient received in recliner eager to participate in mobility. Ambulated independently with steady gait. Returned to room without complaint or incident. Was left dangling EOB with all needs met, call bell in reach.   [place] Zala Degrasse, BS EXP Mobility Specialist Please contact via SecureChat or Rehab office at [phone]

## 2022-07-15 NOTE — Care Management Important Message (Signed)
Important Message  Patient Details  Name: Gerald Crosby MRN: [id number] Date of Birth: 28-Nov-1940   Medicare Important Message Given:  Yes     Shelda Altes 07/15/2022, 12:10 PM

## 2022-07-15 NOTE — Anesthesia Preprocedure Evaluation (Addendum)
Anesthesia Evaluation  Patient identified by MRN, date of birth, ID band Patient awake    Reviewed: Allergy & Precautions, H&P , NPO status , Patient's Chart, lab work & pertinent test results  Airway Mallampati: III  TM Distance: >3 FB Neck ROM: Full    Dental no notable dental hx. (+) Edentulous Upper, Edentulous Lower, Dental Advisory Given   Pulmonary neg pulmonary ROS, former smoker   Pulmonary exam normal breath sounds clear to auscultation       Cardiovascular + Valvular Problems/Murmurs AS  Rhythm:Regular Rate:Normal + Systolic murmurs    Neuro/Psych negative neurological ROS  negative psych ROS   GI/Hepatic negative GI ROS, Neg liver ROS,,,  Endo/Other  diabetes, Type 2, Oral Hypoglycemic Agents    Renal/GU negative Renal ROS  negative genitourinary   Musculoskeletal  (+) Arthritis , Osteoarthritis,    Abdominal   Peds  Hematology negative hematology ROS (+)   Anesthesia Other Findings   Reproductive/Obstetrics negative OB ROS                             Anesthesia Physical Anesthesia Plan  ASA: 4  Anesthesia Plan: MAC   Post-op Pain Management: Tylenol PO (pre-op)*   Induction: Intravenous  PONV Risk Score and Plan: 2 and Propofol infusion, Ondansetron and Dexamethasone  Airway Management Planned: Natural Airway and Simple Face Mask  Additional Equipment: Arterial line  Intra-op Plan:   Post-operative Plan:   Informed Consent: I have reviewed the patients History and Physical, chart, labs and discussed the procedure including the risks, benefits and alternatives for the proposed anesthesia with the patient or authorized representative who has indicated his/her understanding and acceptance.     Dental advisory given  Plan Discussed with: CRNA  Anesthesia Plan Comments:        Anesthesia Quick Evaluation

## 2022-07-15 NOTE — Op Note (Signed)
[facility]   MULTIDISCIPLINARY HEART VALVE TEAM   TAVR OPERATIVE NOTE   Date of Procedure:  07/15/2022  Preoperative Diagnosis: Severe Aortic Stenosis   Postoperative Diagnosis: Same   Procedure:   Transcatheter Aortic Valve Replacement - Percutaneous  Transfemoral Approach  Edwards Sapien 3 Ultra Resilia THV (size 26 mm, serial # [id number])   Co-Surgeons:  Gaye Pollack, MD and Sherren Mocha, MD  Anesthesiologist:  Dr Ola Spurr  Echocardiographer:  Dr Johnsie Cancel  Pre-operative Echo Findings: Very severe aortic stenosis Normal left ventricular systolic function  Post-operative Echo Findings: No paravalvular leak Normal/unchanged left ventricular systolic function  BRIEF CLINICAL NOTE AND INDICATIONS FOR SURGERY 81 year old male with critical aortic stenosis who presents with exertion-related syncope.  Comorbidities include type 2 diabetes, chronic diastolic heart failure, and stage III chronic kidney disease.  The patient is hospitalized after his syncopal episode and undergoes multidisciplinary heart team evaluation including formal cardiac surgical consultation.  He is felt to be a suitable candidate for transfemoral TAVR.  During the course of the patient's preoperative work up they have been evaluated comprehensively by a multidisciplinary team of specialists coordinated through the [facility] in the [facility].  They have been demonstrated to suffer from symptomatic severe aortic stenosis as noted above. The patient has been counseled extensively as to the relative risks and benefits of all options for the treatment of severe aortic stenosis including long term medical therapy, conventional surgery for aortic valve replacement, and transcatheter aortic valve replacement.  The patient has been independently evaluated in formal cardiac surgical consultation by Dr Cyndia Bent, who deemed the patient appropriate for  TAVR. Based upon review of all of the patient's preoperative diagnostic tests they are felt to be candidate for transcatheter aortic valve replacement using the transfemoral approach as an alternative to conventional surgery.    Following the decision to proceed with transcatheter aortic valve replacement, a discussion has been held regarding what types of management strategies would be attempted intraoperatively in the event of life-threatening complications, including whether or not the patient would be considered a candidate for the use of cardiopulmonary bypass and/or conversion to open sternotomy for attempted surgical intervention.  The patient has been advised of a variety of complications that might develop peculiar to this approach including but not limited to risks of death, stroke, paravalvular leak, aortic dissection or other major vascular complications, aortic annulus rupture, device embolization, cardiac rupture or perforation, acute myocardial infarction, arrhythmia, heart block or bradycardia requiring permanent pacemaker placement, congestive heart failure, respiratory failure, renal failure, pneumonia, infection, other late complications related to structural valve deterioration or migration, or other complications that might ultimately cause a temporary or permanent loss of functional independence or other long term morbidity.  The patient provides full informed consent for the procedure as described and all questions were answered preoperatively.  DETAILS OF THE OPERATIVE PROCEDURE  PREPARATION:   The patient is brought to the operating room on the above mentioned date and central monitoring was established by the anesthesia team including placement of a radial arterial line. The patient is placed in the supine position on the operating table.  Intravenous antibiotics are administered. The patient is monitored closely throughout the procedure under conscious sedation.  Baseline  transthoracic echocardiogram is performed. The patient's chest, abdomen, both groins, and both lower extremities are prepared and draped in a sterile manner. A time out procedure is performed.   PERIPHERAL ACCESS:   Using ultrasound guidance,  femoral arterial and venous access is obtained with placement of 6 Fr sheaths on the left side.  [place] images are digitally captured and stored in the patient's chart. A pigtail diagnostic catheter was passed through the femoral arterial sheath under fluoroscopic guidance into the aortic root.  A temporary transvenous pacemaker catheter was passed through the femoral venous sheath under fluoroscopic guidance into the right ventricle.  The pacemaker was tested to ensure stable lead placement and pacemaker capture. Aortic root angiography was performed in order to determine the optimal angiographic angle for valve deployment.  TRANSFEMORAL ACCESS:  A micropuncture technique is used to access the right femoral artery under fluoroscopic and ultrasound guidance.  2 Perclose devices are deployed at 10' and 2' positions to 'PreClose' the femoral artery. An 8 French sheath is placed and then an Amplatz Superstiff wire is advanced through the sheath. This is changed out for a 14 French transfemoral E-Sheath after progressively dilating over the Superstiff wire.  An AL-2 catheter was used to direct a straight-tip exchange length wire across the native aortic valve into the left ventricle. This was exchanged out for a pigtail catheter and position was confirmed in the LV apex. Simultaneous LV and Ao pressures were recorded.  The pigtail catheter was exchanged for an Amplatz Extra-stiff wire in the LV apex.    BALLOON AORTIC VALVULOPLASTY:  Not performed  TRANSCATHETER HEART VALVE DEPLOYMENT:  An Edwards Sapien 3 transcatheter heart valve (size 26 mm) was prepared and crimped per manufacturer's guidelines, and the proper orientation of the valve is confirmed on the [employer] delivery system. The valve was advanced through the introducer sheath using normal technique until in an appropriate position in the abdominal aorta beyond the sheath tip. The balloon was then retracted and using the fine-tuning wheel was centered on the valve. The valve was then advanced across the aortic arch using appropriate flexion of the catheter. The valve was carefully positioned across the aortic valve annulus. The Commander catheter was retracted using normal technique. Once final position of the valve has been confirmed by angiographic assessment, the valve is deployed while temporarily holding ventilation and during rapid ventricular pacing to maintain systolic blood pressure < 50 mmHg and pulse pressure < 10 mmHg. The balloon inflation is held for >3 seconds after reaching full deployment volume. Once the balloon has fully deflated the balloon is retracted into the ascending aorta and valve function is assessed using echocardiography. The patient's hemodynamic recovery following valve deployment is good.  The deployment balloon and guidewire are both removed. Echo demostrated acceptable post-procedural gradients, stable mitral valve function, and no aortic insufficiency.    PROCEDURE COMPLETION:  The sheath was removed and femoral artery closure is performed using the 2 previously deployed Perclose devices.  Protamine is administered once femoral arterial repair was complete. The site is clear with no evidence of bleeding or hematoma after the sutures are tightened. The temporary pacemaker and pigtail catheters are removed. Mynx closure is used for contralateral femoral arterial hemostasis for the 6 Fr sheath.  The patient tolerated the procedure well and is transported to the recovery area in stable condition. There were no immediate intraoperative complications. All sponge instrument and needle counts are verified correct at completion of the operation.   The patient received a total  of 40 mL of intravenous contrast during the procedure.   Sherren Mocha, MD 07/15/2022 5:41 PM

## 2022-07-15 NOTE — Interval H&P Note (Signed)
History and Physical Interval Note:  07/15/2022 2:07 PM  Gerald Crosby  has presented today for surgery, with the diagnosis of Critical Aortic Stenosis.  The various methods of treatment have been discussed with the patient and family. After consideration of risks, benefits and other options for treatment, the patient has consented to  Procedure(s): Transcatheter Aortic Valve Replacement, Transfemoral (N/A) INTRAOPERATIVE TRANSTHORACIC ECHOCARDIOGRAM (N/A) as a surgical intervention.  The patient's history has been reviewed, patient examined, no change in status, stable for surgery.  I have reviewed the patient's chart and labs.  Questions were answered to the patient's satisfaction.     Gaye Pollack

## 2022-07-15 NOTE — Discharge Summary (Incomplete)
[place] VALVE TEAM  Discharge Summary    Patient ID: Gerald Crosby MRN: [id number]; DOB: 18-Feb-1941  Admit date: 07/07/2022 Discharge date: 07/16/2022  Primary Care Provider: Ludwig Clarks, FNP  Primary Cardiologist: Dr. Dellia Cloud, MD (new)/ Dr. Burt Knack & Dr. Cyndia Bent (TAVR)  Discharge Diagnoses    Principal Problem:   Syncope due to Critical aortic valve stenosis Active Problems:   Controlled type 2 diabetes mellitus with hyperglycemia, without long-term current use of insulin (HCC)   Mixed hyperlipidemia   Obesity (BMI 30-39.9)   Stage 3a chronic kidney disease (CKD) (HCC)   S/P TAVR (transcatheter aortic valve replacement)  Allergies No Known Allergies  Diagnostic Studies/Procedures    TAVR OPERATIVE NOTE     Date of Procedure:                07/15/2022   Preoperative Diagnosis:      Severe Aortic Stenosis    Postoperative Diagnosis:    Same    Procedure:        Transcatheter Aortic Valve Replacement - Percutaneous right Transfemoral Approach             Edwards Sapien 3 Ultra Resilia THV (size 26 mm, model # 9755RSL, serial # [id number])              Co-Surgeons:                        Gaye Pollack, MD and Sherren Mocha, MD      Anesthesiologist:                  Suann Larry, MD   Echocardiographer:              Jenkins Rouge, MD   Pre-operative Echo Findings: Severe aortic stenosis Normal left ventricular systolic function   Post-operative Echo Findings: No paravalvular leak Normal left ventricular systolic function ____________  Echo 07/16/22: Completed but pending formal read at the time of discharge   History of Present Illness     Gerald Crosby is a 81 y.o. male with a history of HTN, HLD, Prostate CA and DM2 who initally presented to [facility] with syncope found to have critical aortic stenosis and was transferred to [facility] for inpatient TAVR work up.   Gerald Crosby presented to [facility] 07/07/22 after a  syncopal episode the day of admission. He had a similar event earlier that same day when he experienced profound lightheadedness and EMS was called however he declined transportation to the hospital. Later in the afternoon, he was outside doing light yard work when he developed severe dizziness and weakness. He sat down on a bench and is reported that his wife found him down in the yard. Estimated LOC duration is about 1 minute. He had a similar episode earlier this year which occurred when he was walking back and forth from his mailbox. He was felt to otherwise be very functional at baseline. He has been followed for some time by Dr. Sabra Heck in [place], [place] for a heart murmur however has never been referred to see a cardiologist.    Due to the above symptoms he was admitted for further work-up. Echocardiogram and carotid dopplers were obtained. Carotid dopplers with R/L carotids <50% stenosed. Echocardiogram unfortunately showed preserved LV function at 60-65% with evidence of critical aortic stenosis with an AVA by VTI at 0.4cm2, mean gradient at 59.56mHg, peak at 107.123mg, DI at 0.15, and SVI  at 44. Given critical AS, patient was transferred to [facility] for further workup of his aortic stenosis. R/LHC showed no significant CAD.   The patient was then evaluated by the multidisciplinary valve team and felt to have critical, symptomatic aortic stenosis and to be a suitable candidate for TAVR, which was set up for 07/15/22.     Hospital Course     Severe/critical AS: s/p successful TAVR with a 26 mm Edwards Sapien 3 THV via the TF approach on 07/15/22. Post operative echo pending. Groin sites are stable. ECG with NSR with known 1st degree AV block and no high grade heart block on telemetry review. He was started on ASA '81mg'$  QD. Post TAVR instructions reviewed with understanding. He has ambulated with CRI without issues. He will need lifelong dental SBE which will be RX'ed at [facility] follow up.   HTN: Stable with no  changes needed at this time.   HLD: LDL this admission at 80. Continue pravastatin 40.   DM2: Hb A1C, 6.4. Follow with PCP after discharge. Restart home regimen today.   CKD stage IIIb: Unclear what baseline Cr is trending. He has been in the 1.3-1.6 range here. Will follow OP with repeat BMET. Avoid nephrotoxic meds. May need renal referral in the future.   Consultants: None    The patient was seen and examined by Dr. Burt Knack who feels that he is stable and ready for discharge.  _____________  Discharge Vitals Blood pressure (!) 141/59, pulse 65, temperature (!) 97.3 F (36.3 C), temperature source Oral, resp. rate 15, height '5\' 9"'$  (1.753 m), weight 110.8 kg, SpO2 96 %.  Filed Weights   07/14/22 0545 07/15/22 0434 07/16/22 0502  Weight: 110.3 kg 110.1 kg 110.8 kg   General: Well developed, well nourished, NAD Lungs:Clear to ausculation bilaterally. Breathing is unlabored. Cardiovascular: RRR with S1 S2. Soft flow murmur Abdomen: Soft, non-tender, non-distended. No obvious abdominal masses. Extremities: No edema.  Neuro: Alert and oriented. No focal deficits. No facial asymmetry. MAE spontaneously. Psych: Responds to questions appropriately with normal affect.    Labs & Radiologic Studies    CBC Recent Labs    07/15/22 0210 07/15/22 1519 07/15/22 1656 07/16/22 0118  WBC 6.5  --   --  6.6  HGB 12.3*   < > 12.6* 13.9  HCT 37.2*   < > 37.0* 40.3  MCV 98.2  --   --  96.6  PLT 143*  --   --  138*   < > = values in this interval not displayed.   Basic Metabolic Panel Recent Labs    07/15/22 0210 07/15/22 1519 07/15/22 1656 07/16/22 0118  NA 135   < > 137 133*  K 4.2   < > 4.3 4.9  CL 103   < > 100 103  CO2 26  --   --  21*  GLUCOSE 171*   < > 113* 210*  BUN 23   < > 25* 24*  CREATININE 1.67*   < > 1.60* 1.47*  CALCIUM 9.9  --   --  9.8  MG  --   --   --  1.9   < > = values in this interval not displayed.   Liver Function Tests No results for input(s): "AST",  "ALT", "ALKPHOS", "BILITOT", "PROT", "ALBUMIN" in the last 72 hours. No results for input(s): "LIPASE", "AMYLASE" in the last 72 hours. Cardiac Enzymes No results for input(s): "CKTOTAL", "CKMB", "CKMBINDEX", "TROPONINI" in the last 72 hours. BNP Invalid input(s): "  POCBNP" D-Dimer No results for input(s): "DDIMER" in the last 72 hours. Hemoglobin A1C No results for input(s): "HGBA1C" in the last 72 hours. Fasting Lipid Panel No results for input(s): "CHOL", "HDL", "LDLCALC", "TRIG", "CHOLHDL", "LDLDIRECT" in the last 72 hours. Thyroid Function Tests No results for input(s): "TSH", "T4TOTAL", "T3FREE", "THYROIDAB" in the last 72 hours.  Invalid input(s): "FREET3" _____________  ECHOCARDIOGRAM LIMITED  Result Date: 07/15/2022    ECHOCARDIOGRAM LIMITED REPORT   Patient Name:   Gerald Crosby Date of Exam: 07/15/2022 Medical Rec #:  [id number]       Height:       69.0 in Accession #:    [id number]      Weight:       242.8 lb Date of Birth:  12/25/1940       BSA:          2.243 m Patient Age:    39 years        BP:           166/72 mmHg Patient Gender: M               HR:           58 bpm. Exam Location:  Inpatient Procedure: Limited Echo, Cardiac Doppler and Color Doppler Indications:    Aortic Stenosis  History:        Patient has prior history of Echocardiogram examinations, most                 recent 07/08/2022. Risk Factors:Diabetes and Dyslipidemia.                 Aortic Valve: 26 mm Sapien prosthetic, stented (TAVR) valve is                 present in the aortic position. Procedure Date: 07/15/2022.  Sonographer:    Darlina Sicilian RDCS Referring Phys: [id number] [place]  1. Left ventricular ejection fraction, by estimation, is 55 to 60%. The left ventricle has normal function. There is moderate left ventricular hypertrophy.  2. Left atrial size was mildly dilated.  3. The mitral valve is degenerative. Trivial mitral valve regurgitation.  4. Pre TAVR : calcified tri leaflet  AV with severe AS and mild AR mean gradient supine in OR 55 mmHg peak 84 mmHg with AVA 0.63 cm2         Post TAVR well positioned 26 mm Sapien 3 valve no significant PVL mean gradient 5 peak 9 mmHg with AVA 3.1 cm2 . There is a 26 mm Sapien prosthetic (TAVR) valve present in the aortic position. Procedure Date: 07/15/2022. FINDINGS  Left Ventricle: Left ventricular ejection fraction, by estimation, is 55 to 60%. The left ventricle has normal function. The left ventricular internal cavity size was normal in size. There is moderate left ventricular hypertrophy. Left Atrium: Left atrial size was mildly dilated. Pericardium: There is no evidence of pericardial effusion. Mitral Valve: The mitral valve is degenerative in appearance. There is mild thickening of the mitral valve leaflet(s). There is mild calcification of the mitral valve leaflet(s). Mild mitral annular calcification. Trivial mitral valve regurgitation. Aortic Valve: Pre TAVR : calcified tri leaflet AV with severe AS and mild AR mean gradient supine in OR 55 mmHg peak 84 mmHg with AVA 0.63 cm2 Post TAVR well positioned 26 mm Sapien 3 valve no significant PVL mean gradient 5 peak 9 mmHg with AVA 3.1 cm2. Aortic valve mean gradient measures 5.0 mmHg. Aortic valve peak gradient  measures 8.9 mmHg. Aortic valve area, by VTI measures 3.13 cm. There  is a 26 mm Sapien prosthetic, stented (TAVR) valve present in the aortic position. Procedure Date: 07/15/2022. Aorta: Aortic root could not be assessed. LEFT VENTRICLE PLAX 2D LVOT diam:     2.30 cm LV SV:         96 LV SV Index:   43 LVOT Area:     4.15 cm  AORTIC VALVE AV Area (Vmax):    3.01 cm AV Area (Vmean):   3.30 cm AV Area (VTI):     3.13 cm AV Vmax:           149.00 cm/s AV Vmean:          97.200 cm/s AV VTI:            0.307 m AV Peak Grad:      8.9 mmHg AV Mean Grad:      5.0 mmHg LVOT Vmax:         108.00 cm/s LVOT Vmean:        77.100 cm/s LVOT VTI:          0.231 m LVOT/AV VTI ratio: 0.75  SHUNTS  Systemic VTI:  0.23 m Systemic Diam: 2.30 cm Jenkins Rouge MD Electronically signed by Jenkins Rouge MD Signature Date/Time: 07/15/2022/4:45:14 PM    Final    Structural Heart Procedure  Result Date: 07/15/2022 See surgical note for result.  CT CORONARY MORPH W/CTA COR W/SCORE W/CA W/CM &/OR WO/CM  Addendum Date: 07/11/2022   ADDENDUM REPORT: 07/11/2022 08:37 EXAM: OVER-READ INTERPRETATION  CT CHEST The following report is an over-read performed by radiologist Dr. Maudry Diego [facility] on 07/11/2022. This over-read does not include interpretation of cardiac or coronary anatomy or pathology. The cardiac TAVR interpretation by the cardiologist is attached. COMPARISON:  None. FINDINGS: Extracardiac findings will be described separately under dictation for contemporaneously obtained CTA chest, abdomen and pelvis. IMPRESSION: Please see separate dictation for contemporaneously obtained CTA chest, abdomen and pelvis dated 07/11/2022 for full description of relevant extracardiac findings. Electronically Signed   By: Yetta Glassman M.D.   On: 07/11/2022 08:37   Result Date: 07/11/2022 CLINICAL DATA:  Aortic Stenosis EXAM: Cardiac TAVR CT TECHNIQUE: The patient was scanned on a Siemens Force 782 slice scanner. A 120 kV retrospective scan was triggered in the ascending thoracic aorta at 140 HU's. Gantry rotation speed was 250 msecs and collimation was .6 mm. No beta blockade or nitro were given. The 3D data set was reconstructed in 5% intervals of the R-R cycle. Systolic and diastolic phases were analyzed on a dedicated work station using MPR, MIP and VRT modes. The patient received 80 cc of contrast. FINDINGS: Aortic Valve: Tri leaflet calcified with score  3631 Aorta: Normal arch vessels no aneurysm moderate calcific atherosclerosis Sino-tubular Junction: 27.4 mm Ascending Thoracic Aorta: 33 mm Aortic Arch: 24 mm Descending Thoracic Aorta: 24 mm Sinus of Valsalva Measurements: Non-coronary:  30.7 mm Right - coronary: 28 mm Left -   coronary: 30.7 mm Coronary Artery Height above Annulus: Left Main: 10 mm above annulus Right Coronary: 12.1 mm above annulus Virtual Basal Annulus Measurements: Maximum / Minimum Diameter: 23.7 mm x 22 mm Perimeter: 79.8 mm Area: 459 mm2 Coronary Arteries: Sufficient height above annulus for deployment LM a bit shallow Optimum Fluoroscopic Angle for Delivery: LAO 18 Caudal 11 degrees IMPRESSION: 1. Calcified tri leaflet AV with score 3631 2.  Annular area of 459 mm2 suitable for a 26  mm Sapien 3 valve 3. Optimum angiographic angle for deployment LAO 18 Caudal 11 degrees 4.  Coronary arteries sufficient height for deployment LM - 10 mm 5.  Membranous septal length 7.4 mm Jenkins Rouge Electronically Signed: By: Jenkins Rouge M.D. On: 07/10/2022 16:39   CT ANGIO ABDOMEN PELVIS  W &/OR WO CONTRAST  Result Date: 07/11/2022 CLINICAL DATA:  Preop evaluation for aortic valve replacement EXAM: CT ANGIOGRAPHY CHEST, ABDOMEN AND PELVIS TECHNIQUE: Non-contrast CT of the chest was initially obtained. Multidetector CT imaging through the chest, abdomen and pelvis was performed using the standard protocol during bolus administration of intravenous contrast. Multiplanar reconstructed images and MIPs were obtained and reviewed to evaluate the vascular anatomy. RADIATION DOSE REDUCTION: This exam was performed according to the departmental dose-optimization program which includes automated exposure control, adjustment of the mA and/or kV according to patient size and/or use of iterative reconstruction technique. CONTRAST:  146m OMNIPAQUE IOHEXOL 350 MG/ML SOLN COMPARISON:  None Available. FINDINGS: CTA CHEST FINDINGS Cardiovascular: Cardiomegaly. Aortic valve thickening and calcifications. Normal caliber thoracic aorta with moderate atherosclerotic disease. Standard three-vessel aortic arch with no significant stenosis. Left main lad and RCA coronary artery calcifications.  Mediastinum/Nodes: Esophagus and thyroid are unremarkable. No pathologically enlarged lymph nodes seen in the chest. Lungs/Pleura: Central airways are patent. Bibasilar atelectasis. No consolidation, pleural effusion or pneumothorax. Musculoskeletal: No chest wall abnormality. No acute or significant osseous findings. CTA ABDOMEN AND PELVIS FINDINGS Hepatobiliary: No focal liver abnormality is seen. No gallstones, gallbladder wall thickening, or biliary dilatation. Pancreas: Unremarkable. No pancreatic ductal dilatation or surrounding inflammatory changes. Spleen: Normal in size without focal abnormality. Adrenals/Urinary Tract: Bilateral adrenal glands are unremarkable. No hydronephrosis or nephrolithiasis. Bilateral low-attenuation renal lesions, largest are compatible with simple cysts, others are too small to completely characterize, no specific follow-up imaging is recommended. Bladder is unremarkable. Stomach/Bowel: Stomach is within normal limits. Appendix appears normal. No evidence of bowel wall thickening, distention, or inflammatory changes. Vascular/lymphatic: Normal caliber abdominal aorta with moderate atherosclerotic disease. No significant stenosis. No pathologically enlarged lymph nodes in the abdomen or pelvis. Reproductive: Mild median lobe hypertrophy. Other: No abdominal wall hernia or abnormality. Trace abdominal ascites. Musculoskeletal: No acute or significant osseous findings. VASCULAR MEASUREMENTS PERTINENT TO TAVR: AORTA: Minimal Aortic Diameter-13.3 mm Severity of Aortic Calcification-moderate RIGHT PELVIS: Right Common Iliac Artery - Minimal Diameter-6.9 mm Tortuosity-mild Calcification-moderate Right External Iliac Artery - Minimal Diameter-7.7 mm Tortuosity-moderate Calcification-mild Right Common Femoral Artery - Minimal Diameter-6.0 mm Tortuosity-mild Calcification-mild LEFT PELVIS: Left Common Iliac Artery - Minimal Diameter-6.9 mm Tortuosity-none Calcification-moderate Left  External Iliac Artery - Minimal Diameter-8.3 mm Tortuosity-moderate Calcification-none Left Common Femoral Artery - Minimal Diameter-7.7 mm Tortuosity-mild Calcification-mild Review of the MIP images confirms the above findings. IMPRESSION: 1. Vascular findings and measurements pertinent to potential TAVR procedure, as detailed above. 2. Thickening and calcification of the aortic valve, compatible with reported clinical history of aortic stenosis. 3. Moderate aortoiliac atherosclerosis. Left main lad and RCA coronary artery calcifications. Electronically Signed   By: LYetta GlassmanM.D.   On: 07/11/2022 08:36   CT ANGIO CHEST AORTA W/CM & OR WO/CM  Result Date: 07/11/2022 CLINICAL DATA:  Preop evaluation for aortic valve replacement EXAM: CT ANGIOGRAPHY CHEST, ABDOMEN AND PELVIS TECHNIQUE: Non-contrast CT of the chest was initially obtained. Multidetector CT imaging through the chest, abdomen and pelvis was performed using the standard protocol during bolus administration of intravenous contrast. Multiplanar reconstructed images and MIPs were obtained and reviewed to evaluate the vascular anatomy. RADIATION DOSE REDUCTION:  This exam was performed according to the departmental dose-optimization program which includes automated exposure control, adjustment of the mA and/or kV according to patient size and/or use of iterative reconstruction technique. CONTRAST:  164m OMNIPAQUE IOHEXOL 350 MG/ML SOLN COMPARISON:  None Available. FINDINGS: CTA CHEST FINDINGS Cardiovascular: Cardiomegaly. Aortic valve thickening and calcifications. Normal caliber thoracic aorta with moderate atherosclerotic disease. Standard three-vessel aortic arch with no significant stenosis. Left main lad and RCA coronary artery calcifications. Mediastinum/Nodes: Esophagus and thyroid are unremarkable. No pathologically enlarged lymph nodes seen in the chest. Lungs/Pleura: Central airways are patent. Bibasilar atelectasis. No consolidation,  pleural effusion or pneumothorax. Musculoskeletal: No chest wall abnormality. No acute or significant osseous findings. CTA ABDOMEN AND PELVIS FINDINGS Hepatobiliary: No focal liver abnormality is seen. No gallstones, gallbladder wall thickening, or biliary dilatation. Pancreas: Unremarkable. No pancreatic ductal dilatation or surrounding inflammatory changes. Spleen: Normal in size without focal abnormality. Adrenals/Urinary Tract: Bilateral adrenal glands are unremarkable. No hydronephrosis or nephrolithiasis. Bilateral low-attenuation renal lesions, largest are compatible with simple cysts, others are too small to completely characterize, no specific follow-up imaging is recommended. Bladder is unremarkable. Stomach/Bowel: Stomach is within normal limits. Appendix appears normal. No evidence of bowel wall thickening, distention, or inflammatory changes. Vascular/lymphatic: Normal caliber abdominal aorta with moderate atherosclerotic disease. No significant stenosis. No pathologically enlarged lymph nodes in the abdomen or pelvis. Reproductive: Mild median lobe hypertrophy. Other: No abdominal wall hernia or abnormality. Trace abdominal ascites. Musculoskeletal: No acute or significant osseous findings. VASCULAR MEASUREMENTS PERTINENT TO TAVR: AORTA: Minimal Aortic Diameter-13.3 mm Severity of Aortic Calcification-moderate RIGHT PELVIS: Right Common Iliac Artery - Minimal Diameter-6.9 mm Tortuosity-mild Calcification-moderate Right External Iliac Artery - Minimal Diameter-7.7 mm Tortuosity-moderate Calcification-mild Right Common Femoral Artery - Minimal Diameter-6.0 mm Tortuosity-mild Calcification-mild LEFT PELVIS: Left Common Iliac Artery - Minimal Diameter-6.9 mm Tortuosity-none Calcification-moderate Left External Iliac Artery - Minimal Diameter-8.3 mm Tortuosity-moderate Calcification-none Left Common Femoral Artery - Minimal Diameter-7.7 mm Tortuosity-mild Calcification-mild Review of the MIP images  confirms the above findings. IMPRESSION: 1. Vascular findings and measurements pertinent to potential TAVR procedure, as detailed above. 2. Thickening and calcification of the aortic valve, compatible with reported clinical history of aortic stenosis. 3. Moderate aortoiliac atherosclerosis. Left main lad and RCA coronary artery calcifications. Electronically Signed   By: LYetta GlassmanM.D.   On: 07/11/2022 08:36   CARDIAC CATHETERIZATION  Result Date: 07/09/2022   Dist Cx lesion is 10% stenosed.   Mid LAD lesion is 10% stenosed. No significant coronary obstructive disease with large relatively normal LAD, ramus intermediate, left circumflex, and RCA. Severely calcified aortic valve with significant restriction of motion. No attempt to cross the aortic valve with echo Doppler data from yesterday showing critical AS with a mean gradient of 73 mmHg, peak to peak gradient of 107 mmHg, and aortic valve area estimate of 0.4 to 0.5 cm. RECOMMENDATION: Patient will be admitted.  Surgical consultation for aortic valve replacement.  Will need scheduling with CT imaging per structural team.   ECHOCARDIOGRAM COMPLETE  Result Date: 07/08/2022    ECHOCARDIOGRAM REPORT   Patient Name:   Gerald SUSMANDate of Exam: 07/08/2022 Medical Rec #:  [id number]      Height:       69.0 in Accession #:    [id number]     Weight:       259.5 lb Date of Birth:  815-Jul-1942      BSA:          2.308 m Patient Age:  81 years        BP:           124/55 mmHg Patient Gender: M               HR:           71 bpm. Exam Location:  Inpatient Procedure: 2D Echo, 3D Echo, Cardiac Doppler and Color Doppler Indications:    Syncope R55  History:        Patient has no prior history of Echocardiogram examinations.                 Signs/Symptoms:Syncope; Risk Factors:Former Smoker, Diabetes and                 Dyslipidemia.  Sonographer:    Greer Pickerel Referring Phys: [id number] OLADAPO ADEFESO  Sonographer Comments: Patient is obese. Image  acquisition challenging due to respiratory motion. IMPRESSIONS  1. Left ventricular ejection fraction, by estimation, is 60 to 65%. The left ventricle has normal function. The left ventricle has no regional wall motion abnormalities. There is severe left ventricular hypertrophy. Left ventricular diastolic parameters  are indeterminate.  2. Right ventricular systolic function is normal. The right ventricular size is normal. There is normal pulmonary artery systolic pressure.  3. There is severe calcifcation of the aortic valve. There is evidence of critical aortic valve stenosis with Vmax 5.7 m/sec, mean pressure gradient 73 mm Hg and aortic valve area of 0.4 cm2 and trivial aortic valve regurgitation.  4. The mitral valve is grossly normal. Trivial mitral valve regurgitation. No evidence of mitral stenosis.  5. The inferior vena cava is dilated in size with <50% respiratory variability, suggesting right atrial pressure of 15 mmHg. FINDINGS  Left Ventricle: Left ventricular ejection fraction, by estimation, is 60 to 65%. The left ventricle has normal function. The left ventricle has no regional wall motion abnormalities. The left ventricular internal cavity size was normal in size. There is  severe left ventricular hypertrophy. Left ventricular diastolic parameters are indeterminate. Right Ventricle: The right ventricular size is normal. No increase in right ventricular wall thickness. Right ventricular systolic function is normal. There is normal pulmonary artery systolic pressure. The tricuspid regurgitant velocity is 1.97 m/s, and  with an assumed right atrial pressure of 8 mmHg, the estimated right ventricular systolic pressure is 45.4 mmHg. Left Atrium: Left atrial size was normal in size. Right Atrium: Right atrial size was normal in size. Pericardium: There is no evidence of pericardial effusion. Mitral Valve: The mitral valve is grossly normal. Trivial mitral valve regurgitation. No evidence of mitral valve  stenosis. Tricuspid Valve: The tricuspid valve is not well visualized. Tricuspid valve regurgitation is not demonstrated. No evidence of tricuspid stenosis. Aortic Valve: There is severe calcifcation of the aortic valve. There is severe aortic valve annular calcification. Aortic valve regurgitation is trivial. Severe aortic stenosis is present. Aortic valve mean gradient measures 59.7 mmHg. Aortic valve peak  gradient measures 107.1 mmHg. Aortic valve area, by VTI measures 0.48 cm. Pulmonic Valve: The pulmonic valve was not well visualized. Pulmonic valve regurgitation is not visualized. No evidence of pulmonic stenosis. Aorta: The aortic root is normal in size and structure. Venous: The inferior vena cava is dilated in size with Gerald than 50% respiratory variability, suggesting right atrial pressure of 15 mmHg. IAS/Shunts: No atrial level shunt detected by color flow Doppler.  LEFT VENTRICLE PLAX 2D LVIDd:         4.60 cm   Diastology LVIDs:  3.20 cm   LV e' medial:    4.46 cm/s LV PW:         1.20 cm   LV E/e' medial:  20.9 LV IVS:        0.90 cm   LV e' lateral:   5.22 cm/s LVOT diam:     2.00 cm   LV E/e' lateral: 17.9 LV SV:         60 LV SV Index:   26 LVOT Area:     3.14 cm                           3D Volume EF:                          3D EF:        57 %                          LV EDV:       242 ml                          LV ESV:       105 ml                          LV SV:        137 ml RIGHT VENTRICLE RV S prime:     12.30 cm/s TAPSE (M-mode): 2.3 cm LEFT ATRIUM           Index        RIGHT ATRIUM           Index LA diam:      4.50 cm 1.95 cm/m   RA Area:     11.30 cm LA Vol (A2C): 68.4 ml 29.64 ml/m  RA Volume:   22.70 ml  9.84 ml/m LA Vol (A4C): 73.5 ml 31.85 ml/m  AORTIC VALVE AV Area (Vmax):    0.56 cm AV Area (Vmean):   0.54 cm AV Area (VTI):     0.48 cm AV Vmax:           517.33 cm/s AV Vmean:          357.667 cm/s AV VTI:            1.240 m AV Peak Grad:      107.1 mmHg AV Mean  Grad:      59.7 mmHg LVOT Vmax:         93.00 cm/s LVOT Vmean:        61.300 cm/s LVOT VTI:          0.191 m LVOT/AV VTI ratio: 0.15  AORTA Ao Root diam: 3.60 cm Ao Asc diam:  3.80 cm MITRAL VALVE                TRICUSPID VALVE MV Area (PHT): 3.03 cm     TR Peak grad:   15.5 mmHg MV Decel Time: 250 msec     TR Vmax:        197.00 cm/s MV E velocity: 93.40 cm/s MV A velocity: 111.00 cm/s  SHUNTS MV E/A ratio:  0.84         Systemic VTI:  0.19 m  Systemic Diam: 2.00 cm Vishnu Priya Mallipeddi Electronically signed by Lorelee Cover Mallipeddi Signature Date/Time: 07/08/2022/12:25:11 PM    Final    US Carotid Bilateral  Result Date: 07/08/2022 CLINICAL DATA:  81 year old male with history of syncope. EXAM: BILATERAL CAROTID DUPLEX ULTRASOUND TECHNIQUE: Pearline Cables scale imaging, color Doppler and duplex ultrasound were performed of bilateral carotid and vertebral arteries in the neck. COMPARISON:  None Available. FINDINGS: Criteria: Quantification of carotid stenosis is based on velocity parameters that correlate the residual internal carotid diameter with NASCET-based stenosis levels, using the diameter of the distal internal carotid lumen as the denominator for stenosis measurement. The following velocity measurements were obtained: RIGHT ICA: Peak systolic velocity 78 cm/sec, End diastolic velocity 20 cm/sec CCA: Peak systolic velocity 88 cm/sec SYSTOLIC ICA/CCA RATIO:  0.9 ECA: Peak systolic velocity 597 cm/sec LEFT ICA: Peak systolic velocity 89 cm/sec, End diastolic velocity 27 cm/sec CCA: 416 cm/sec SYSTOLIC ICA/CCA RATIO:  0.8 ECA: 128 cm/sec RIGHT CAROTID ARTERY: Moderate multifocal atherosclerotic plaque formation, most prominent at the carotid bulb. No significant tortuosity. Normal low resistance waveforms. RIGHT VERTEBRAL ARTERY:  Antegrade flow. LEFT CAROTID ARTERY: Moderate multifocal atherosclerotic plaque formation, most prominent about the carotid bulb. No significant tortuosity.  Normal low resistance waveforms. LEFT VERTEBRAL ARTERY:  Antegrade flow. Upper extremity non-invasive blood pressures: Not obtained. IMPRESSION: 1. 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 2. Left carotid artery system: Gerald than 50% stenosis secondary to moderate multifocal atherosclerotic plaque formation. 3.  Vertebral artery system: Patent with antegrade flow bilaterally. Ruthann Cancer, MD [facility] Electronically Signed   By: Ruthann Cancer M.D.   On: 07/08/2022 09:53   DG Chest Port 1 View  Result Date: 07/07/2022 CLINICAL DATA:  Syncope. EXAM: PORTABLE CHEST 1 VIEW COMPARISON:  None Available. FINDINGS: Normal cardiomediastinal contours. Lung volumes are low with slight asymmetric elevation of left hemidiaphragm. There are linear opacities identified within the left lower lung which are favored to represent areas of scarring or atelectasis. No signs of pleural effusion or interstitial edema. No airspace opacities. IMPRESSION: Low lung volumes with linear opacities within the left lower lung favored to represent scarring or atelectasis. Electronically Signed   By: Kerby Moors M.D.   On: 07/07/2022 20:23   Disposition   Pt is being discharged home today in good condition.  Follow-up Plans & Appointments    Follow-up Information     Tommie Raymond, NP. Go on 07/30/2022.   Specialty: Cardiology Why: @ 8:15 am, please arrive at least 10 min early. Contact information: [address] [place] [address] [phone]                Discharge Instructions     Call MD for:  difficulty breathing, headache or visual disturbances   Complete by: As directed    Call MD for:  extreme fatigue   Complete by: As directed    Call MD for:  hives   Complete by: As directed    Call MD for:  persistant dizziness or light-headedness   Complete  by: As directed    Call MD for:  persistant nausea and vomiting   Complete by: As directed    Call MD for:  redness, tenderness, or signs of infection (pain, swelling, redness, odor or green/yellow discharge around incision site)   Complete by: As directed    Call MD for:  severe uncontrolled pain   Complete  by: As directed    Call MD for:  temperature >100.4   Complete by: As directed    Diet - low sodium heart healthy   Complete by: As directed    Discharge instructions   Complete by: As directed    ACTIVITY AND EXERCISE  Daily activity and exercise are an important part of your recovery. People recover at different rates depending on their general health and type of valve procedure.  Most people recovering from TAVR feel better relatively quickly   No lifting, pushing, pulling more than 10 pounds (examples to avoid: groceries, vacuuming, gardening, golfing):             - For one week with a procedure through the groin.             - For six weeks for procedures through the chest wall or neck. NOTE: You will typically see one of our providers 7-14 days after your procedure to discuss Halma the above activities.      DRIVING  Do not drive until you are seen for follow up and cleared by a provider. Generally, we ask patient to not drive for 1 week after their procedure.  If you have been told by your doctor in the past that you may not drive, you must talk with him/her before you begin driving again.   DRESSING  Groin site: you may leave the clear dressing over the site for up to one week or until it falls off.   HYGIENE  If you had a femoral (leg) procedure, you may take a shower when you return home. After the shower, pat the site dry. Do NOT use powder, oils or lotions in your groin area until the site has completely healed.  If you had a chest procedure, you may shower when you return home unless specifically instructed not to by your discharging practitioner.              - DO NOT scrub incision; pat dry with a towel.             - DO NOT apply any lotions, oils, powders to the incision.             - No tub baths / swimming for at least 2 weeks.  If you notice any fevers, chills, increased pain, swelling, bleeding or pus, please contact your doctor.   ADDITIONAL INFORMATION  If you are going to have an upcoming dental procedure, please contact our office as you will require antibiotics ahead of time to prevent infection on your heart valve.    If you have any questions or concerns you can call the structural heart phone during normal business hours 8am-4pm. If you have an urgent need after hours or weekends please call [phone] to talk to the on call provider for general cardiology. If you have an emergency that requires immediate attention, please call 911.    After TAVR Checklist  Check  Test Description  Follow up appointment in 1-2 weeks  You will see our structural heart advanced practice provider. Your incision sites will be checked and you will be cleared to drive and resume all normal activities if you are doing well.    1 month echo and follow up  You will have an echo to check on your new heart valve and be seen back in the office by a structural heart advanced practice provider.  Follow up with your primary cardiologist You will need  to be seen by your primary cardiologist in the following 3-6 months after your 1 month appointment in the valve clinic.   1 year echo and follow up You will have another echo to check on your heart valve after 1 year and be seen back in the office by a structural heart advanced practice provider. This your last structural heart visit.  Bacterial endocarditis prophylaxis  You will have to take antibiotics for the rest of your life before all dental procedures (even teeth cleanings) to protect your heart valve. Antibiotics are also required before some surgeries. Please check with your cardiologist before  scheduling any surgeries. Also, please make sure to tell us if you have a penicillin allergy as you will require an alternative antibiotic.   Discharge wound care:   Complete by: As directed    May remove Bandaid tomorrow and leave open to air   Increase activity slowly   Complete by: As directed       Discharge Medications   Allergies as of 07/16/2022   No Known Allergies      Medication List     TAKE these medications    aspirin EC 81 MG tablet Take 1 tablet (81 mg total) by mouth daily. Swallow whole. Start taking on: July 17, 2022   diclofenac Sodium 1 % Gel Commonly known as: Voltaren Apply 2 g topically 4 (four) times daily as needed (pain).   doxazosin 8 MG tablet Commonly known as: CARDURA Take 8 mg by mouth daily.   glipiZIDE 5 MG 24 hr tablet Commonly known as: GLUCOTROL XL Take 5 mg by mouth daily with breakfast.   lovastatin 40 MG tablet Commonly known as: MEVACOR Take 80 mg by mouth at bedtime.   Vitamin D3 1.25 MG (50000 UT) Caps Take 1 capsule by mouth daily.               Discharge Care Instructions  (From admission, onward)           Start     Ordered   07/16/22 0000  Discharge wound care:       Comments: May remove Bandaid tomorrow and leave open to air   07/16/22 1129            Outstanding Labs/Studies   BMET  Duration of Discharge Encounter   Greater than 30 minutes including physician time.  SignedKathyrn Drown, NP 07/16/2022, 11:30 AM [phone]   Patient seen, examined. Available data reviewed. Agree with findings, assessment, and plan as outlined by Kathyrn Drown, NP.  The patient is independently interviewed and examined.  He is clinically doing very well and is walking the halls without symptoms.  His telemetry is reviewed and shows normal sinus rhythm with no arrhythmia.  There is no evidence of AV block.  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  The patient is medically stable for hospital discharge today on medications as outlined above.  Sherren Mocha, M.D. 07/16/2022 4:47 PM

## 2022-07-15 NOTE — Anesthesia Procedure Notes (Signed)
Arterial Line Insertion Start/End11/14/2023 2:20 PM, 07/15/2022 2:33 PM Performed by: Inda Coke, CRNA, CRNA  Patient location: Pre-op. Preanesthetic checklist: patient identified, IV checked, site marked, risks and benefits discussed, surgical consent, monitors and equipment checked, pre-op evaluation, timeout performed and anesthesia consent Lidocaine 1% used for infiltration Right, radial was placed Catheter size: 20 G Hand hygiene performed  and maximum sterile barriers used  Allen's test indicative of satisfactory collateral circulation Attempts: 1 Procedure performed without using ultrasound guided technique. Following insertion, dressing applied and Biopatch. Post procedure assessment: normal  Patient tolerated the procedure well with no immediate complications.

## 2022-07-15 NOTE — Op Note (Signed)
[facility]   MULTIDISCIPLINARY HEART VALVE TEAM   TAVR OPERATIVE NOTE   Date of Procedure:  07/15/2022  Preoperative Diagnosis: Severe Aortic Stenosis   Postoperative Diagnosis: Same   Procedure:   Transcatheter Aortic Valve Replacement - Percutaneous right Transfemoral Approach  Edwards Sapien 3 Ultra Resilia THV (size 26 mm, model # 9755RSL, serial # [id number])   Co-Surgeons:  Gaye Pollack, MD and Sherren Mocha, MD    Anesthesiologist:  Suann Larry, MD  Echocardiographer:  Jenkins Rouge, MD  Pre-operative Echo Findings: Severe aortic stenosis Normal left ventricular systolic function  Post-operative Echo Findings: No paravalvular leak Normal left ventricular systolic function   BRIEF CLINICAL NOTE AND INDICATIONS FOR SURGERY  Gerald Crosby is a 81 y.o. male with symptoms of severe, stage D1 aortic stenosis with NYHA Class ll symptoms of marked exertional fatigue followed by dizziness and syncope. I have reviewed the patient's recent echocardiogram which is notable for normal LV systolic function and critical aortic stenosis with peak gradient of 107.63mHg and mean transvalvular gradient of 59.758mg. The patient's dimensionless index is 0.15 and calculated aortic valve area is 0.4cm.    R/LHC demonstrated patent coronary arteries with mild nonobstructive CAD. His right heart pressures were noted to be elevated. CT imaging showed a calcified AV with a calcium score at 3631 with an area at 45674msuitable for a 73m51mUR valve.    Given his advanced age I think that transcatheter aortic valve replacement be the best option for treating him.  His gated cardiac CTA shows anatomy suitable for TAVR using a 26 mm SAPIEN 3 valve.  His abdominal and pelvic CTA shows adequate pelvic vascular anatomy to allow transfemoral insertion.   The patient was counseled at length regarding treatment alternatives for management of severe symptomatic aortic stenosis.  The risks and benefits of surgical intervention has been discussed in detail. Long-term prognosis with medical therapy was discussed. Alternative approaches such as conventional surgical aortic valve replacement, transcatheter aortic valve replacement, and palliative medical therapy were compared and contrasted at length. This discussion was placed in the context of the patient's own specific clinical presentation and past medical history. All of his questions have been addressed.    Following the decision to proceed with transcatheter aortic valve replacement, a discussion was held regarding what types of management strategies would be attempted intraoperatively in the event of life-threatening complications, including whether or not the patient would be considered a candidate for the use of cardiopulmonary bypass and/or conversion to open sternotomy for attempted surgical intervention.  He is 81 y22rs old so I would only consider emergency sternotomy to manage any intraoperative problems depending on the circumstances.  The patient is aware of the fact that transient use of cardiopulmonary bypass may be necessary. The patient has been advised of a variety of complications that might develop including but not limited to risks of death, stroke, paravalvular leak, aortic dissection or other major vascular complications, aortic annulus rupture, device embolization, cardiac rupture or perforation, mitral regurgitation, acute myocardial infarction, arrhythmia, heart block or bradycardia requiring permanent pacemaker placement, congestive heart failure, respiratory failure, renal failure, pneumonia, infection, other late complications related to structural valve deterioration or migration, or other complications that might ultimately cause a temporary or permanent loss of functional independence or other long term morbidity. The patient provides full informed consent for the procedure as described and all questions  were answered.       DETAILS OF THE OPERATIVE PROCEDURE  PREPARATION:  The patient was brought to the operating room on the above mentioned date and appropriate monitoring was established by the anesthesia team. The patient was placed in the supine position on the operating table.  Intravenous antibiotics were administered. The patient was monitored closely throughout the procedure under conscious sedation.    Baseline transthoracic echocardiogram was performed. The patient's abdomen and both groins were prepped and draped in a sterile manner. A time out procedure was performed.   PERIPHERAL ACCESS:    Using the modified Seldinger technique, femoral arterial and venous access was obtained with placement of 6 Fr sheaths on the left side.  A pigtail diagnostic catheter was passed through the left arterial sheath under fluoroscopic guidance into the aortic root.  A temporary transvenous pacemaker catheter was passed through the left femoral venous sheath under fluoroscopic guidance into the right ventricle.  The pacemaker was tested to ensure stable lead placement and pacemaker capture. Aortic root angiography was performed in order to determine the optimal angiographic angle for valve deployment.   TRANSFEMORAL ACCESS:   Percutaneous transfemoral access and sheath placement was performed using ultrasound guidance.  The right common femoral artery was cannulated using a micropuncture needle and appropriate location was verified using hand injection angiogram.  A pair of Abbott Perclose percutaneous closure devices were placed and a 6 French sheath replaced into the femoral artery.  The patient was heparinized systemically and ACT verified > 250 seconds.    A 14 Fr transfemoral E-sheath was introduced into the right common femoral artery after progressively dilating over an Amplatz superstiff wire. An AL-1 catheter was used to direct a straight-tip exchange length wire across the native aortic  valve into the left ventricle. This was exchanged out for a pigtail catheter and position was confirmed in the LV apex. Simultaneous LV and Ao pressures were recorded.  The pigtail catheter was exchanged for a Safari wire in the LV apex.   BALLOON AORTIC VALVULOPLASTY:   Not performed   TRANSCATHETER HEART VALVE DEPLOYMENT:   An Edwards Sapien 3 Ultra transcatheter heart valve (size 26 mm) was prepared and crimped per manufacturer's guidelines, and the proper orientation of the valve is confirmed on the [employer] delivery system. The valve was advanced through the introducer sheath using normal technique until in an appropriate position in the abdominal aorta beyond the sheath tip. The balloon was then retracted and using the fine-tuning wheel was centered on the valve. The valve was then advanced across the aortic arch using appropriate flexion of the catheter. The valve was carefully positioned across the aortic valve annulus. The Commander catheter was retracted using normal technique. Once final position of the valve has been confirmed by angiographic assessment, the valve is deployed during rapid ventricular pacing to maintain systolic blood pressure < 50 mmHg and pulse pressure < 10 mmHg. The balloon inflation is held for >3 seconds after reaching full deployment volume. Once the balloon has fully deflated the balloon is retracted into the ascending aorta and valve function is assessed using echocardiography. There is felt to be no paravalvular leak and no central aortic insufficiency.  The patient's hemodynamic recovery following valve deployment is good.  The deployment balloon and guidewire are both removed.    PROCEDURE COMPLETION:   The sheath was removed and femoral artery closure performed.  Protamine was administered once femoral arterial repair was complete. The temporary pacemaker, pigtail catheter and femoral sheaths were removed with manual pressure used for venous hemostasis.   A Mynx femoral  closure device was utilized following removal of the diagnostic sheath in the left femoral artery.  The patient tolerated the procedure well and is transported to the cath lab recovery area in stable condition. There were no immediate intraoperative complications. All sponge instrument and needle counts are verified correct at completion of the operation.   No blood products were administered during the operation.  The patient received a total of 40 mL of intravenous contrast during the procedure.   Gaye Pollack, MD 07/15/2022

## 2022-07-15 NOTE — Discharge Instructions (Signed)

## 2022-07-15 NOTE — Transfer of Care (Signed)
Immediate Anesthesia Transfer of Care Note  Patient: Shirline Frees  Procedure(s) Performed: Transcatheter Aortic Valve Replacement, Transfemoral using Edwards SAPIEN 3 Ultra 26 MM Heart Valve (Chest) INTRAOPERATIVE TRANSTHORACIC ECHOCARDIOGRAM  Patient Location: PACU and Cath Lab  Anesthesia Type:MAC  Level of Consciousness: drowsy and patient cooperative  Airway & Oxygen Therapy: Patient Spontanous Breathing and Patient connected to face mask oxygen  Post-op Assessment: Report given to RN and Post -op Vital signs reviewed and stable  Post vital signs: Reviewed and stable  Last Vitals:  Vitals Value Taken Time  BP 130/48   Temp    Pulse 51 07/15/22 1639  Resp 16 07/15/22 1639  SpO2 98 % 07/15/22 1639  Vitals shown include unvalidated device data.  Last Pain:  Vitals:   07/15/22 1400  TempSrc:   PainSc: 0-No pain      Patients Stated Pain Goal: 0 (97/41/63 [id number])  Complications: No notable events documented.

## 2022-07-15 NOTE — Progress Notes (Signed)
  [facility] VALVE TEAM  Patient doing well s/p TAVR. He is hemodynamically stable but very drowsy. Groin sites stable. 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 Plan for early ambulation after bedrest completed and hopeful discharge over the next 24-48 hours.   Angelena Form PA-C  [facility]  Pager [phone]

## 2022-07-15 NOTE — Progress Notes (Addendum)
Rounding Note    Patient Name: Gerald Crosby Date of Encounter: 07/15/2022  [place] Cardiologist: None   Subjective   Patient denies chest pain, shortness of breath, or dizziness. Patient ambulating without SOB, anginal or syncopal symptoms. Pt scheduled for TAVR today.   Inpatient Medications    Scheduled Meds:  aspirin EC  81 mg Oral Daily   chlorhexidine  1 Application Topical Once   chlorhexidine  15 mL Mouth/Throat Once   heparin injection (subcutaneous)  5,000 Units Subcutaneous Q8H   insulin aspart  0-15 Units Subcutaneous TID WC   insulin aspart  0-5 Units Subcutaneous QHS   insulin glargine-yfgn  10 Units Subcutaneous QHS   magnesium sulfate  40 mEq Other To OR   potassium chloride  80 mEq Other To OR   pravastatin  40 mg Oral q1800   sodium chloride flush  3 mL Intravenous Q12H   Continuous Infusions:  sodium chloride     sodium chloride      ceFAZolin (ANCEF) IV     dexmedetomidine     heparin 30,000 units/NS 1000 mL solution for CELLSAVER     norepinephrine (LEVOPHED) Adult infusion     PRN Meds: sodium chloride, sodium chloride, acetaminophen, diclofenac Sodium, ondansetron **OR** ondansetron (ZOFRAN) IV, sodium chloride flush   Vital Signs    Vitals:   07/14/22 1646 07/14/22 2233 07/15/22 0434 07/15/22 0806  BP: (!) 115/58 (!) 131/58 (!) 115/56 (!) 119/58  Pulse: 71 66 68 72  Resp: '18 18 18 18  '$ Temp: 98.1 F (36.7 C) 97.6 F (36.4 C) 97.8 F (36.6 C) 98 F (36.7 C)  TempSrc: Oral Oral Oral Oral  SpO2: 95% 95% 92% 96%  Weight:   110.1 kg   Height:       No intake or output data in the 24 hours ending 07/15/22 0959    07/15/2022    4:34 AM 07/14/2022    5:45 AM 07/13/2022    6:10 AM  Last 3 Weights  Weight (lbs) 242 lb 12.8 oz 243 lb 1.6 oz 244 lb 6.4 oz  Weight (kg) 110.133 kg 110.269 kg 110.859 kg      Telemetry    NSR, HR between 60-70- Personally Reviewed  ECG    No EKG done today  Physical Exam   GEN:  No acute distress.  Well-nourished, well-developed.  Comfortably communicating with complete sentences. Neck: No JVD Cardiac: RRR, crescendo-decrescendo murmur noted to right chest wall between first and second intercostal space.  No cyanosis or pedal edema appreciated. Respiratory: Clear to auscultation bilaterally.  No wheeze rales or rhonchi appreciated GI: Soft, nontender, non-distended  MS: No edema; No deformity. Neuro:  Nonfocal  Psych: Normal affect.  Alert and oriented x3.  Labs    High Sensitivity Troponin:   Recent Labs  Lab 07/07/22 2204 07/08/22 0025 07/08/22 0207 07/08/22 0343 07/08/22 0650  TROPONINIHS 376* 439* 467* 461* 495*     Chemistry Recent Labs  Lab 07/10/22 0222 07/11/22 0145 07/15/22 0210  NA 138 134* 135  K 4.1 4.0 4.2  CL 105 100 103  CO2 '27 26 26  '$ GLUCOSE 128* 134* 171*  BUN '14 15 23  '$ CREATININE 1.66* 1.65* 1.67*  CALCIUM 9.7 9.7 9.9  GFRNONAA 41* 41* 40*  ANIONGAP '6 8 6    '$ Lipids  Recent Labs  Lab 07/09/22 0424  CHOL 135  TRIG 48  HDL 45  LDLCALC 80  CHOLHDL 3.0    Hematology Recent Labs  Lab 07/09/22 0424 07/09/22 1159 07/09/22 1207 07/10/22 0222 07/15/22 0210  WBC 6.0  --   --  6.0 6.5  RBC 3.64*  --   --  3.68* 3.79*  HGB 11.9*   < > 12.2* 12.1* 12.3*  HCT 36.4*   < > 36.0* 36.8* 37.2*  MCV 100.0  --   --  100.0 98.2  MCH 32.7  --   --  32.9 32.5  MCHC 32.7  --   --  32.9 33.1  RDW 12.3  --   --  12.0 11.9  PLT 127*  --   --  132* 143*   < > = values in this interval not displayed.   Thyroid No results for input(s): "TSH", "FREET4" in the last 168 hours.  BNPNo results for input(s): "BNP", "PROBNP" in the last 168 hours.  DDimer No results for input(s): "DDIMER" in the last 168 hours.   Radiology    No results found.  Cardiac Studies   Cardiac Cath: 07/09/2022    Dist Cx lesion is 10% stenosed.   Mid LAD lesion is 10% stenosed.   No significant coronary obstructive disease with large relatively normal  LAD, ramus intermediate, left circumflex, and RCA.   Diagnostic Dominance: Right   Patient Profile     Gerald Crosby is an 81 year old African American male with a history of T2DM, BPH, hyperlipidemia, chronic kidney disease, and obesity who presented to ED via EMS on 07/07/22 after multiple syncopal episode. After finding critical AS on echo, he was then transferred to [facility] for structural heart evaluation.     Assessment & Plan    1) Severe AS with syncope:  Patient presented to emergency department with multiple syncope episodes. He is asymptomatic since he is in the hospital. Denies CP, SOB, dizziness or syncope episodes. Has been ambulating without shortness of breath, dizziness or chest pain.  Echocardiogram: 11/07: showed preserved LV function at 60-65% with evidence of severe calcifcation of the aortic valve critical aortic stenosis ,aortic valve area of  0.4cm2 and mean pressure gradient  73 mm Hg. Cardiac cath 11/08: No significant coronary obstructive disease. Plan: -TAVR today, 11/14.  -Start ASA 81 mg after the procedure -Consider SGLT2i and Losartan after AS treatment given DM    2) Kidney disease: BMP on 07/15/22:  Cr stable at 1.6  -Will continue to monitor.   3) DM2: Sliding scale while inpatient.  -Plan to resume home regimen at d/c.  -Consider SGLT2i given DM   4) Chronic diastolic HF:  BNP slightly elevated. Likely secondary to critical AS. Denies SOB, PND ore orthopnea. No signs of volume overload. Lungs CTA, no pedal edema.  Appears euvolemic today.    For questions or updates, please contact [place] Please consult [email or web address] for contact info under      Signed, Teola Bradley, MD  07/15/2022, 9:59 AM    [id number]  Patient seen, examined. Available data reviewed. Agree with findings, assessment, and plan as outlined by Teola Bradley, MD. the patient is independently interviewed and examined.  He is alert, oriented, in no distress.  He  is sitting up at the bedside after just ambulating a short distance with our mobility specialist.  I have answered all of his questions about TAVR and we plan to proceed this afternoon.  Otherwise as outlined above.  Patient remains medically stable for his procedure today.  Sherren Mocha, M.D. 07/15/2022 12:31 PM

## 2022-07-16 ENCOUNTER — Inpatient Hospital Stay (HOSPITAL_COMMUNITY): Payer: Medicare HMO

## 2022-07-16 ENCOUNTER — Encounter (HOSPITAL_COMMUNITY): Payer: Self-pay | Admitting: Cardiovascular Disease

## 2022-07-16 DIAGNOSIS — Z952 Presence of prosthetic heart valve: Secondary | ICD-10-CM | POA: Diagnosis not present

## 2022-07-16 DIAGNOSIS — Z1152 Encounter for screening for COVID-19: Secondary | ICD-10-CM | POA: Diagnosis not present

## 2022-07-16 DIAGNOSIS — Z006 Encounter for examination for normal comparison and control in clinical research program: Secondary | ICD-10-CM | POA: Diagnosis not present

## 2022-07-16 DIAGNOSIS — I350 Nonrheumatic aortic (valve) stenosis: Secondary | ICD-10-CM | POA: Diagnosis not present

## 2022-07-16 DIAGNOSIS — I5033 Acute on chronic diastolic (congestive) heart failure: Secondary | ICD-10-CM | POA: Diagnosis not present

## 2022-07-16 LAB — ECHOCARDIOGRAM COMPLETE
AR max vel: 2.69 cm2
AV Area VTI: 3.36 cm2
AV Area mean vel: 2.73 cm2
AV Mean grad: 13.3 mmHg
AV Peak grad: 26 mmHg
Ao pk vel: 2.55 m/s
Area-P 1/2: 2.62 cm2
Calc EF: 65 %
Height: 69 in
S' Lateral: 2.7 cm
Single Plane A2C EF: 61 %
Single Plane A4C EF: 66.9 %
Weight: 3908.31 oz

## 2022-07-16 LAB — CBC
HCT: 40.3 % (ref 39.0–52.0)
Hemoglobin: 13.9 g/dL (ref 13.0–17.0)
MCH: 33.3 pg (ref 26.0–34.0)
MCHC: 34.5 g/dL (ref 30.0–36.0)
MCV: 96.6 fL (ref 80.0–100.0)
Platelets: 138 10*3/uL — ABNORMAL LOW (ref 150–400)
RBC: 4.17 MIL/uL — ABNORMAL LOW (ref 4.22–5.81)
RDW: 11.7 % (ref 11.5–15.5)
WBC: 6.6 10*3/uL (ref 4.0–10.5)
nRBC: 0 % (ref 0.0–0.2)

## 2022-07-16 LAB — GLUCOSE, CAPILLARY
Glucose-Capillary: 150 mg/dL — ABNORMAL HIGH (ref 70–99)
Glucose-Capillary: 178 mg/dL — ABNORMAL HIGH (ref 70–99)
Glucose-Capillary: 265 mg/dL — ABNORMAL HIGH (ref 70–99)

## 2022-07-16 LAB — BASIC METABOLIC PANEL
Anion gap: 9 (ref 5–15)
BUN: 24 mg/dL — ABNORMAL HIGH (ref 8–23)
CO2: 21 mmol/L — ABNORMAL LOW (ref 22–32)
Calcium: 9.8 mg/dL (ref 8.9–10.3)
Chloride: 103 mmol/L (ref 98–111)
Creatinine, Ser: 1.47 mg/dL — ABNORMAL HIGH (ref 0.61–1.24)
GFR, Estimated: 47 mL/min — ABNORMAL LOW (ref >60)
Glucose, Bld: 210 mg/dL — ABNORMAL HIGH (ref 70–99)
Potassium: 4.9 mmol/L (ref 3.5–5.1)
Sodium: 133 mmol/L — ABNORMAL LOW (ref 135–145)

## 2022-07-16 LAB — MAGNESIUM: Magnesium: 1.9 mg/dL (ref 1.7–2.4)

## 2022-07-16 MED ORDER — ASPIRIN 81 MG PO TBEC: 81.0000 mg | DELAYED_RELEASE_TABLET | Freq: Every day | ORAL | 12 refills | Status: AC

## 2022-07-16 NOTE — Anesthesia Postprocedure Evaluation (Signed)
Anesthesia Post Note  Patient: Gerald Crosby  Procedure(s) Performed: Transcatheter Aortic Valve Replacement, Transfemoral using Edwards SAPIEN 3 Ultra 26 MM Heart Valve (Chest) INTRAOPERATIVE TRANSTHORACIC ECHOCARDIOGRAM     Patient location during evaluation: Other Anesthesia Type: MAC Level of consciousness: awake and alert Pain management: pain level controlled Vital Signs Assessment: post-procedure vital signs reviewed and stable Respiratory status: spontaneous breathing, nonlabored ventilation and respiratory function stable Cardiovascular status: stable and blood pressure returned to baseline Postop Assessment: no apparent nausea or vomiting Anesthetic complications: no  No notable events documented.  Last Vitals:  Vitals:   07/16/22 0503 07/16/22 0753  BP: (!) 107/54 (!) 141/59  Pulse: (!) 53 65  Resp: 12 15  Temp: (!) 36 C (!) 36.3 C  SpO2:  96%    Last Pain:  Vitals:   07/16/22 0753  TempSrc: Oral  PainSc: 0-No pain                 Coda Mathey,W. EDMOND

## 2022-07-16 NOTE — Progress Notes (Signed)
Mobility Specialist Progress Note:   07/16/22 0902  Mobility  Activity Ambulated with assistance in hallway  Level of Assistance Standby assist, set-up cues, supervision of patient - no hands on  Assistive Device Front wheel walker  Distance Ambulated (ft) 450 ft  Activity Response Tolerated well  $Mobility charge 1 Mobility   Pt received in chair willing to participate in mobility. No complaints of pain. Left in chair with call bell in reach and all needs met.   During Mobility:  92 HR Post Mobility:  [place] Specialist Please contact via [place] or  Rehab Office at [phone]

## 2022-07-16 NOTE — Addendum Note (Signed)
Addendum  created 07/16/22 1433 by Josephine Igo, CRNA   Intraprocedure Meds edited

## 2022-07-16 NOTE — Progress Notes (Signed)
CARDIAC REHAB PHASE I   Pt has ambulated x2 today and hoping to d/c later. Discussed restrictions, walking at home, and CRPII. Pt receptive. Not interested in CRPII due to distance. He has a stationary bike that he was using PTA.  [id number]  Yves Dill BS, ACSM-CEP 07/16/2022 10:48 AM

## 2022-07-16 NOTE — Progress Notes (Signed)
Discharge education reviewed.  Pt expresses understanding. IVs and telemetry removed, CCMD notified.  Pt son at main entrance for pickup.

## 2022-07-16 NOTE — Progress Notes (Signed)
Echocardiogram 2D Echocardiogram has been performed.  Gerald Crosby 07/16/2022, 11:56 AM

## 2022-07-17 ENCOUNTER — Telehealth: Payer: Self-pay | Admitting: Cardiology

## 2022-07-17 LAB — TYPE AND SCREEN
ABO/RH(D): O POS
Antibody Screen: NEGATIVE
Unit division: 0
Unit division: 0

## 2022-07-17 LAB — BPAM RBC
Blood Product Expiration Date: 202312052359
Blood Product Expiration Date: 202312052359
ISSUE DATE / TIME: 202311140659
ISSUE DATE / TIME: 202311140659
Unit Type and Rh: 5100
Unit Type and Rh: 5100

## 2022-07-17 NOTE — Telephone Encounter (Signed)
  HEART AND VASCULAR CENTER   MULTIDISCIPLINARY HEART VALVE TEAM   Attempted to contact the patient for [facility] call with no answer. Will try again tomorrow, 07/18/22.   Kathyrn Drown NP-C Structural Heart Team  Pager: [phone] Phone: [phone]

## 2022-07-18 ENCOUNTER — Telehealth: Payer: Self-pay

## 2022-07-18 MED FILL — Magnesium Sulfate Inj 50%: INTRAMUSCULAR | Qty: 10 | Status: AC

## 2022-07-18 MED FILL — Potassium Chloride Inj 2 mEq/ML: INTRAVENOUS | Qty: 40 | Status: AC

## 2022-07-18 MED FILL — Heparin Sodium (Porcine) Inj 1000 Unit/ML: Qty: 1000 | Status: AC

## 2022-07-18 NOTE — Telephone Encounter (Signed)
Patient contacted regarding discharge from [facility] on 07/16/2022.  Patient understands to follow up with provider Structural App on 07/30/2022 at 8:15 AM at [facility] office. Patient understands discharge instructions? yes Patient understands medications and regiment? yes Patient understands to bring all medications to this visit? yes

## 2022-07-29 NOTE — Progress Notes (Unsigned)
[place]                                     Cardiology Office Note:    Date:  07/30/2022   ID:  Shirline Frees, DOB 11/10/1940, MRN [id number]  PCP:  No primary care provider on file.  [facility] Cardiologist: Dr. Sabra Heck [place], [place])/ Dr. Burt Knack, MD & Dr. Cyndia Bent, MD (TAVR)  Referring MD: Ludwig Clarks, FNP   Chief Complaint  Patient presents with   Follow-up    TOC s/p TAVR   History of Present Illness:    Gerald Crosby is a 81 y.o. male with a hx of HTN, HLD, Prostate CA and DM2 who initally presented to [facility] with syncope found to have critical aortic stenosis and was transferred to [facility] for inpatient TAVR work up, completed 07/14/22. He is here for TOC follow up.    Gerald Crosby presented to [facility] ED 07/07/22 after a syncopal episode the day of admission. He had a similar event earlier that same day when he experienced profound lightheadedness and EMS was called however he declined transportation to the hospital. Later in the afternoon, he was outside doing light yard work when he developed severe dizziness and weakness. He sat down on a bench and is reported that his wife found him down in the yard. Estimated LOC duration is about 1 minute. He had a similar episode earlier this year which occurred when he was walking back and forth from his mailbox. He was felt to otherwise be very functional at baseline. He has been followed for some time by Dr. Sabra Heck in [place], [place] for a heart murmur however has never been referred to see a cardiologist.    Due to the above symptoms he was admitted for further work-up. Echocardiogram and carotid dopplers were obtained. Carotid dopplers with R/L carotids <50% stenosed. Echocardiogram unfortunately showed preserved LV function at 60-65% with evidence of critical aortic stenosis with an AVA by VTI at 0.4cm2, mean gradient at 59.52mHg, peak at 107.11mg, DI at 0.15, and SVI at 26. Given  critical AS, [facility]. R/LHC showed no significant CAD.    He was then evaluated by the multidisciplinary valve team and felt to have critical, symptomatic aortic stenosis and to be a suitable candidate to proceed. He is now s/p successful TAVR with a 26 mm Edwards Sapien 3 THV via the TF approach on 07/15/22. Post operative echo showed stable valve function with 2668m3UR with no significant PVL with a mean gradient at 13.3mm71m peak at 26mm71mand AVA by VTI at 2.7cm2. ECG with NSR with known 1st degree AV block and no high grade heart block on telemetry review. He was started on ASA '81mg'$  QD.   He is here today with his wife and reports that he has been doing very well since discharge with no recurrent symptoms of dizziness, syncope, SOB, chest pain, LE edema, and orthopnea. He is tolerating ASA with no bleeding. Groin sites look great.   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 Burt Knack & Dr. Cyndia Bent   Vitamin D deficiency     Past Surgical History:  Procedure Laterality Date   INTRAOPERATIVE TRANSTHORACIC ECHOCARDIOGRAM N/A 07/15/2022   Procedure: INTRAOPERATIVE TRANSTHORACIC ECHOCARDIOGRAM;  Surgeon: Sherren Mocha, MD;  Location: [facility];  Service: Open Heart Surgery;  Laterality: N/A;   RIGHT HEART CATH AND CORONARY ANGIOGRAPHY N/A 07/09/2022   Procedure: RIGHT HEART CATH AND CORONARY ANGIOGRAPHY;  Surgeon: Troy Sine, MD;  Location: [facility];  Service: Cardiovascular;  Laterality: N/A;   right knee sx     TRANSCATHETER AORTIC VALVE REPLACEMENT, TRANSFEMORAL N/A 07/15/2022   Procedure: Transcatheter Aortic Valve Replacement, Transfemoral using Edwards SAPIEN 3 Ultra 26 MM Heart  Valve;  Surgeon: Sherren Mocha, MD;  Location: [facility];  Service: Open Heart Surgery;  Laterality: N/A;  Transfemoral (right groin)    Current Medications: No outpatient medications have been marked as taking for the 07/30/22 encounter (Office Visit) with [facility].     Allergies:   Patient has no known allergies.   Social History   Socioeconomic History   Marital status: Married    Spouse name: Not on file   Number of children: Not on file   Years of education: Not on file   Highest education level: Not on file  Occupational History   Not on file  Tobacco Use   Smoking status: Former   Smokeless tobacco: Never  Vaping Use   Vaping Use: Never used  Substance and Sexual Activity   Alcohol use: Never   Drug use: Never   Sexual activity: Not on file  Other Topics Concern   Not on file  Social History Narrative   Not on file   Social Determinants of Health   Financial Resource Strain: Not on file  Food Insecurity: No Food Insecurity (07/09/2022)   Hunger Vital Sign    Worried About Running Out of Food in the Last Year: Never true    Ran Out of Food in the Last Year: Never true  Transportation Needs: No Transportation Needs (07/09/2022)   PRAPARE - Hydrologist (Medical): No    Lack of Transportation (Non-Medical): No  Physical Activity: Not on file  Stress: Not on file  Social Connections: Not on file     Family History: The patient's family history includes Heart murmur in his mother.  ROS:   Please see the history of present illness.    All other systems reviewed and are negative.  EKGs/Labs/Other Studies Reviewed:    The following studies were reviewed today:  TAVR OPERATIVE NOTE     Date of Procedure:                07/15/2022   Preoperative Diagnosis:      Severe Aortic Stenosis    Postoperative Diagnosis:    Same    Procedure:        Transcatheter Aortic Valve Replacement - Percutaneous right  Transfemoral Approach             Edwards Sapien 3 Ultra Resilia THV (size 26 mm, model # 9755RSL, serial # [id number])              Co-Surgeons:                        Gaye Pollack, MD and Sherren Mocha, MD      Anesthesiologist:  Suann Larry, MD   Echocardiographer:              Jenkins Rouge, MD   Pre-operative Echo Findings: Severe aortic stenosis Normal left ventricular systolic function   Post-operative Echo Findings: No paravalvular leak Normal left ventricular systolic function ____________   Echo 07/16/22:    1. Left ventricular ejection fraction, by estimation, is 60 to 65%. The  left ventricle has normal function. The left ventricle has no regional  wall motion abnormalities. Left ventricular diastolic parameters are  consistent with Grade I diastolic  dysfunction (impaired relaxation). Elevated left ventricular end-diastolic  pressure.   2. Right ventricular systolic function is normal. The right ventricular  size is normal. There is normal pulmonary artery systolic pressure.   3. Left atrial size was moderately dilated.   4. The mitral valve is degenerative. No evidence of mitral valve  regurgitation. No evidence of mitral stenosis.   5. Post TAVR with 26 mm Sapien 3 valve no significant PVL mean gradient  13.3 mm peak 26 mmhg AVA 2.7 cm2 DVI 0.68. The aortic valve has been  repaired/replaced. Aortic valve regurgitation is not visualized. No aortic  stenosis is present. There is a 26 mm   Sapien prosthetic (TAVR) valve present in the aortic position. Procedure  Date: 07/15/22.   6. The inferior vena cava is normal in size with greater than 50%  respiratory variability, suggesting right atrial pressure of 3 mmHg.    EKG:  EKG is ordered today.  The ekg ordered today demonstrates NSR with HR, 64bpm  Recent Labs: 07/07/2022: B Natriuretic Peptide 146.0 07/08/2022: ALT 17 07/16/2022: BUN 24; Creatinine, Ser 1.47; Hemoglobin 13.9; Magnesium  1.9; Platelets 138; Potassium 4.9; Sodium 133   Recent Lipid Panel    Component Value Date/Time   CHOL 135 07/09/2022 0424   TRIG 48 07/09/2022 0424   HDL 45 07/09/2022 0424   CHOLHDL 3.0 07/09/2022 0424   VLDL 10 07/09/2022 0424   LDLCALC 80 07/09/2022 0424   Physical Exam:    VS:  BP (!) 130/58 (BP Location: Left Arm)   Pulse 67   Resp 20   Wt 248 lb 0.4 oz (112.5 kg)   SpO2 97%   BMI 36.63 kg/m     Wt Readings from Last 3 Encounters:  07/30/22 248 lb 0.4 oz (112.5 kg)  07/16/22 244 lb 4.3 oz (110.8 kg)  05/13/22 250 lb (113.4 kg)    General: Well developed, well nourished, NAD Lungs:Clear to ausculation bilaterally. No wheezes, rales, or rhonchi. Breathing is unlabored. Cardiovascular: RRR with S1 S2. No murmurs Extremities: No edema.  Neuro: Alert and oriented. No focal deficits. No facial asymmetry. MAE spontaneously. Psych: Responds to questions appropriately with normal affect.    ASSESSMENT/PLAN:    Severe/critical AS: Patient doing great with NYHA class I symptoms s/p successful TAVR with a 26 mm Edwards Sapien 3 THV via the TF approach on 07/15/22. Post operative echo showed stable valve function with 14m S3UR with no significant PVL with a mean gradient at 13.353mg, peak at 2636m, and AVA by VTI at 2.7cm2. Groin sites remain stable. ECG with NSR today and no high grade heart block on telemetry review. Tolerating ASA '81mg'$  QD. No dental SBE needed as he is edentulous with full dentures. Follow up for 1 month echo and OV with our team.    HTN: Stable with no changes needed at this time.    HLD: LDL this admission at 80. Continue pravastatin 40.  DM2: Hb A1C, 6.4. Scheduled to see PCP next week. Restarted on home regimen at d/c.    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 Reports today that he follows every 3 months with PCP for this. Sees PCP next week.   Medication Adjustments/Labs and Tests Ordered: Current medicines are reviewed at length  with the patient today.  Concerns regarding medicines are outlined above.  Orders Placed This Encounter  Procedures   EKG 12-Lead   No orders of the defined types were placed in this encounter.   Patient Instructions  Medication Instructions:  Your physician recommends that you continue on your current medications as directed. Please refer to the Current Medication list given to you today.  *If you need a refill on your cardiac medications before your next appointment, please call your pharmacy*  Lab Work: None ordered.  If you have labs (blood work) drawn today and your tests are completely normal, you will receive your results only by: [employer] (if you have MyChart) OR A paper copy in the mail If you have any lab test that is abnormal or we need to change your treatment, we will call you to review the results.  Testing/Procedures: None ordered.  Follow-Up: At [facility], you and your health needs are our priority.  As part of our continuing mission to provide you with exceptional heart care, we have created designated Provider Care Teams.  These Care Teams include your primary Cardiologist (physician) and Advanced Practice Providers (APPs -  Physician Assistants and Nurse Practitioners) who all work together to provide you with the care you need, when you need it.  We recommend signing up for the patient portal called "MyChart".  Sign up information is provided on this After Visit Summary.  MyChart is used to connect with patients for Virtual Visits (Telemedicine).  Patients are able to view lab/test results, encounter notes, upcoming appointments, etc.  Non-urgent messages can be sent to your provider as well.   To learn more about what you can do with MyChart, go to [email or web address].    Your next appointment:     The format for your next appointment:   In Person  Provider:    Important Information About Sugar         Signed, Kathyrn Drown, NP   07/30/2022 8:49 AM    [place]

## 2022-07-30 ENCOUNTER — Ambulatory Visit: Payer: Medicare HMO | Attending: Physician Assistant | Admitting: Cardiology

## 2022-07-30 VITALS — BP 130/58 | HR 67 | Resp 20 | Wt 248.0 lb

## 2022-07-30 DIAGNOSIS — E782 Mixed hyperlipidemia: Secondary | ICD-10-CM | POA: Diagnosis not present

## 2022-07-30 DIAGNOSIS — E1165 Type 2 diabetes mellitus with hyperglycemia: Secondary | ICD-10-CM | POA: Diagnosis not present

## 2022-07-30 DIAGNOSIS — I350 Nonrheumatic aortic (valve) stenosis: Secondary | ICD-10-CM

## 2022-07-30 DIAGNOSIS — N1831 Chronic kidney disease, stage 3a: Secondary | ICD-10-CM | POA: Diagnosis not present

## 2022-07-30 DIAGNOSIS — I10 Essential (primary) hypertension: Secondary | ICD-10-CM | POA: Diagnosis not present

## 2022-07-30 DIAGNOSIS — Z952 Presence of prosthetic heart valve: Secondary | ICD-10-CM | POA: Diagnosis not present

## 2022-07-30 NOTE — Patient Instructions (Addendum)
Medication Instructions:  Your physician recommends that you continue on your current medications as directed. Please refer to the Current Medication list given to you today.  *If you need a refill on your cardiac medications before your next appointment, please call your pharmacy*  Lab Work: None ordered.  If you have labs (blood work) drawn today and your tests are completely normal, you will receive your results only by: [place] (if you have MyChart) OR A paper copy in the mail If you have any lab test that is abnormal or we need to change your treatment, we will call you to review the results.  Testing/Procedures: None ordered.  Follow-Up: At [facility], you and your health needs are our priority.  As part of our continuing mission to provide you with exceptional heart care, we have created designated Provider Care Teams.  These Care Teams include your primary Cardiologist (physician) and Advanced Practice Providers (APPs -  Physician Assistants and Nurse Practitioners) who all work together to provide you with the care you need, when you need it.  We recommend signing up for the patient portal called "MyChart".  Sign up information is provided on this After Visit Summary.  MyChart is used to connect with patients for Virtual Visits (Telemedicine).  Patients are able to view lab/test results, encounter notes, upcoming appointments, etc.  Non-urgent messages can be sent to your provider as well.   To learn more about what you can do with MyChart, go to [email or web address].    Your next appointment:   08/18/2022 at 1130 pm Echocardiogram at [facility]  1230 pm with Kathyrn Drown, NP      The format for your next appointment:   In Person  Provider:    Important Information About Sugar      E

## 2022-08-05 DIAGNOSIS — E1122 Type 2 diabetes mellitus with diabetic chronic kidney disease: Secondary | ICD-10-CM | POA: Diagnosis not present

## 2022-08-05 DIAGNOSIS — Z23 Encounter for immunization: Secondary | ICD-10-CM | POA: Diagnosis not present

## 2022-08-05 DIAGNOSIS — I10 Essential (primary) hypertension: Secondary | ICD-10-CM | POA: Diagnosis not present

## 2022-08-05 DIAGNOSIS — E782 Mixed hyperlipidemia: Secondary | ICD-10-CM | POA: Diagnosis not present

## 2022-08-05 DIAGNOSIS — N183 Chronic kidney disease, stage 3 unspecified: Secondary | ICD-10-CM | POA: Diagnosis not present

## 2022-08-14 NOTE — Progress Notes (Signed)
Virtual Visit via Telephone Note   Because of Gerald Crosby's co-morbid illnesses, he is at least at moderate risk for complications without adequate follow up.  This format is felt to be most appropriate for this patient at this time.  The patient did not have access to video technology/had technical difficulties with video requiring transitioning to audio format only (telephone).  All issues noted in this document were discussed and addressed.  No physical exam could be performed with this format.  Please refer to the patient's chart for his consent to telehealth for [facility].    Date:  08/18/2022   ID:  Gerald Crosby, DOB 12-10-1940, MRN [id number] The patient was identified using 2 identifiers.  Patient Location: Home Provider Location: Office/Clinic   PCP:  No primary care provider on file.   [place] Providers Cardiologist:  None     Evaluation Performed:  Follow-Up Visit  Chief Complaint: 1 month s/p TAVR  History of Present Illness:    Gerald Crosby is a 81 y.o. male with with a hx of HTN, HLD, Prostate CA and DM2 who initally presented to [facility] with syncope found to have critical aortic stenosis and was transferred to [facility] for inpatient TAVR work up, completed 07/14/22. He missed his echocardiogram and in-person OV today therefore he was contacted via telephone for his visit today.    Gerald Crosby presented to [facility] 07/07/22 after a syncopal episode the day of admission. He had a similar event earlier that same day when he experienced profound lightheadedness and EMS was called however he declined transportation to the hospital. Later in the afternoon, he was outside doing light yard work when he developed severe dizziness and weakness. He sat down on a bench and is reported that his wife found him down in the yard. Estimated LOC duration is about 1 minute. He had a similar episode earlier this year which occurred when he was walking back and forth  from his mailbox. He was felt to otherwise be very functional at baseline. He has been followed for some time by Dr. Sabra Heck in [place], [place] for a heart murmur however has never been referred to see a cardiologist.    Due to the above symptoms he was admitted for further work-up. Echocardiogram and carotid dopplers were obtained. Carotid dopplers with R/L carotids <50% stenosed. Echocardiogram unfortunately showed preserved LV function at 60-65% with evidence of critical aortic stenosis with an AVA by VTI at 0.4cm2, mean gradient at 59.37mHg, peak at 107.119mg, DI at 0.15, and SVI at 26. Given critical AS, [facility] R/LHC showed no significant CAD.    He was then evaluated by the multidisciplinary valve team and felt to have critical, symptomatic aortic stenosis and to be a suitable candidate to proceed. He is now s/p successful TAVR with a 26 mm Edwards Sapien 3 THV via the TF approach on 07/15/22. Post operative echo showed stable valve function with 2663m3UR with no significant PVL with a mean gradient at 13.3mm91m peak at 26mm59mand AVA by VTI at 2.7cm2. ECG with NSR with known 1st degree AV block and no high grade heart block on telemetry review. He was started on ASA '81mg'$  QD.    On TOC follow up with was doing well with no specific complaints. He unfortunately missed his echocardiogram today and was contacted for follow up. He reports that he is doing very well with no  issues with chest pain, syncope, SOB, LE edema, orthopnea, dizziness, or bleeding. He can tell an improvement in his symptoms since TAVR and is able to perform ADL/IADLs without any issues.    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  Burt Knack & Dr. Cyndia Bent   Vitamin D deficiency    Past Surgical History:  Procedure Laterality Date   INTRAOPERATIVE TRANSTHORACIC ECHOCARDIOGRAM N/A 07/15/2022   Procedure: INTRAOPERATIVE TRANSTHORACIC ECHOCARDIOGRAM;  Surgeon: Sherren Mocha, MD;  Location: [place];  Service: Open Heart Surgery;  Laterality: N/A;   RIGHT HEART CATH AND CORONARY ANGIOGRAPHY N/A 07/09/2022   Procedure: RIGHT HEART CATH AND CORONARY ANGIOGRAPHY;  Surgeon: Troy Sine, MD;  Location: [place] [facility];  Service: Cardiovascular;  Laterality: N/A;   right knee sx     TRANSCATHETER AORTIC VALVE REPLACEMENT, TRANSFEMORAL N/A 07/15/2022   Procedure: Transcatheter Aortic Valve Replacement, Transfemoral using Edwards SAPIEN 3 Ultra 26 MM Heart Valve;  Surgeon: Sherren Mocha, MD;  Location: [place];  Service: Open Heart Surgery;  Laterality: N/A;  Transfemoral (right groin)     No outpatient medications have been marked as taking for the 08/18/22 encounter (Office Visit) with [facility].     Allergies:   Patient has no known allergies.   Social History   Tobacco Use   Smoking status: Former   Smokeless tobacco: Never  Scientific laboratory technician Use: Never used  Substance Use Topics   Alcohol use: Never   Drug use: Never     Family Hx: The patient's family history includes Heart murmur in his mother.  ROS:   Please see the history of present illness.     All other systems reviewed and are negative.  Prior CV studies:   The following studies were reviewed today:  TAVR OPERATIVE NOTE     Date of Procedure:                07/15/2022   Preoperative Diagnosis:      Severe Aortic Stenosis    Postoperative Diagnosis:    Same    Procedure:        Transcatheter Aortic Valve Replacement - Percutaneous right Transfemoral Approach             Edwards Sapien 3 Ultra Resilia THV (size 26 mm, model # 9755RSL, serial # [id number])              Co-Surgeons:                        Gaye Pollack, MD and Sherren Mocha, MD      Anesthesiologist:                  Suann Larry, MD   Echocardiographer:              Jenkins Rouge, MD   Pre-operative Echo Findings: Severe aortic stenosis Normal left ventricular systolic function   Post-operative Echo Findings: No paravalvular leak Normal left ventricular systolic function ____________   Echo 07/16/22:    1. Left ventricular ejection fraction, by estimation, is 60 to 65%. The  left ventricle has normal function. The left ventricle has no  regional  wall motion abnormalities. Left ventricular diastolic parameters are  consistent with Grade I diastolic  dysfunction (impaired relaxation). Elevated left ventricular end-diastolic  pressure.   2. Right ventricular systolic function is normal. The right ventricular  size is normal. There is normal pulmonary artery systolic pressure.   3. Left atrial size was moderately dilated.   4. The mitral valve is degenerative. No evidence of mitral valve  regurgitation. No evidence of mitral stenosis.   5. Post TAVR with 26 mm Sapien 3 valve no significant PVL mean gradient  13.3 mm peak 26 mmhg AVA 2.7 cm2 DVI 0.68. The aortic valve has been  repaired/replaced. Aortic valve regurgitation is not visualized. No aortic  stenosis is present. There is a 26 mm   Sapien prosthetic (TAVR) valve present in the aortic position. Procedure  Date: 07/15/22.   6. The inferior vena cava is normal in size with greater than 50%  respiratory variability, suggesting right atrial pressure of 3 mmHg.   Labs/Other Tests and Data Reviewed:    EKG:  No ECG reviewed.  Recent Labs: 07/07/2022: B Natriuretic Peptide 146.0 07/08/2022: ALT 17 07/16/2022: BUN 24; Creatinine, Ser 1.47; Hemoglobin 13.9; Magnesium 1.9; Platelets 138; Potassium 4.9; Sodium 133   Recent Lipid Panel Lab Results  Component Value Date/Time   CHOL 135 07/09/2022 04:24 AM   TRIG 48 07/09/2022 04:24 AM   HDL 45 07/09/2022 04:24  AM   CHOLHDL 3.0 07/09/2022 04:24 AM   LDLCALC 80 07/09/2022 04:24 AM   Wt Readings from Last 3 Encounters:  07/30/22 248 lb 0.4 oz (112.5 kg)  07/16/22 244 lb 4.3 oz (110.8 kg)  05/13/22 250 lb (113.4 kg)       Objective:    Vital Signs:  There were no vitals taken for this visit.   VITAL SIGNS:  reviewed GEN:  no acute distress  ASSESSMENT & PLAN:    Severe/critical AS: Patient doing great with NYHA class I symptoms s/p successful TAVR with a 26 mm Edwards Sapien 3 THV via the TF approach on 07/15/22. Post operative echo showed stable valve function with 6m S3UR with no significant PVL with a mean gradient at 13.350mg, peak at 2663m, and AVA by VTI at 2.7cm2. He missed his echocardiogram and has been rescheduled for 09/15/22. Will follow results. Continues to tolerate ASA '81mg'$  QD. No dental SBE needed as he is edentulous with full dentures.    HTN: Stable with no changes needed at this time.    HLD: LDL this admission at 80. Continue pravastatin 40.    DM2: Hb A1C, 6.4. Scheduled to see PCP next week. Restarted on home regimen at d/c.    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 Reports today that he follows every 3 months with PCP for this.   Time:   Today, I have spent 10 minutes with the patient with telehealth technology discussing the above problems.     Medication Adjustments/Labs and Tests Ordered: Current medicines are reviewed at length with the patient today.  Concerns regarding medicines are outlined above.   Tests Ordered: No orders of the defined types were placed in this encounter.   Medication Changes: No orders of the defined types were placed in this encounter.   Follow Up:  In Person  one year s/p TAVR  Signed, JilKathyrn DrownP  08/18/2022 1:32 PM    [place]

## 2022-08-18 ENCOUNTER — Other Ambulatory Visit (HOSPITAL_COMMUNITY): Payer: Medicare HMO

## 2022-08-18 ENCOUNTER — Ambulatory Visit: Payer: Medicare HMO | Attending: Cardiology | Admitting: Cardiology

## 2022-08-18 DIAGNOSIS — I350 Nonrheumatic aortic (valve) stenosis: Secondary | ICD-10-CM

## 2022-08-18 DIAGNOSIS — Z952 Presence of prosthetic heart valve: Secondary | ICD-10-CM

## 2022-08-18 DIAGNOSIS — I10 Essential (primary) hypertension: Secondary | ICD-10-CM

## 2022-08-18 DIAGNOSIS — N1831 Chronic kidney disease, stage 3a: Secondary | ICD-10-CM | POA: Diagnosis not present

## 2022-08-18 DIAGNOSIS — E782 Mixed hyperlipidemia: Secondary | ICD-10-CM | POA: Diagnosis not present

## 2022-09-09 DIAGNOSIS — C61 Malignant neoplasm of prostate: Secondary | ICD-10-CM | POA: Diagnosis not present

## 2022-09-15 ENCOUNTER — Ambulatory Visit (HOSPITAL_COMMUNITY): Payer: Medicare HMO | Attending: Cardiology

## 2022-09-15 DIAGNOSIS — Z952 Presence of prosthetic heart valve: Secondary | ICD-10-CM | POA: Diagnosis not present

## 2022-09-15 LAB — ECHOCARDIOGRAM COMPLETE
AR max vel: 1.58 cm2
AV Area VTI: 1.85 cm2
AV Area mean vel: 1.77 cm2
AV Mean grad: 9.5 mmHg
AV Peak grad: 21.2 mmHg
Ao pk vel: 2.3 m/s
Area-P 1/2: 2.83 cm2
S' Lateral: 3 cm

## 2022-09-16 ENCOUNTER — Other Ambulatory Visit: Payer: Self-pay | Admitting: Physician Assistant

## 2022-09-16 DIAGNOSIS — Z952 Presence of prosthetic heart valve: Secondary | ICD-10-CM

## 2022-11-12 DIAGNOSIS — E1122 Type 2 diabetes mellitus with diabetic chronic kidney disease: Secondary | ICD-10-CM | POA: Diagnosis not present

## 2022-11-12 DIAGNOSIS — I10 Essential (primary) hypertension: Secondary | ICD-10-CM | POA: Diagnosis not present

## 2022-11-12 DIAGNOSIS — E559 Vitamin D deficiency, unspecified: Secondary | ICD-10-CM | POA: Diagnosis not present

## 2022-11-12 DIAGNOSIS — N183 Chronic kidney disease, stage 3 unspecified: Secondary | ICD-10-CM | POA: Diagnosis not present

## 2022-11-12 DIAGNOSIS — E785 Hyperlipidemia, unspecified: Secondary | ICD-10-CM | POA: Diagnosis not present

## 2022-11-19 DIAGNOSIS — I10 Essential (primary) hypertension: Secondary | ICD-10-CM | POA: Diagnosis not present

## 2022-11-19 DIAGNOSIS — E1122 Type 2 diabetes mellitus with diabetic chronic kidney disease: Secondary | ICD-10-CM | POA: Diagnosis not present

## 2022-11-19 DIAGNOSIS — N183 Chronic kidney disease, stage 3 unspecified: Secondary | ICD-10-CM | POA: Diagnosis not present

## 2022-11-19 DIAGNOSIS — E782 Mixed hyperlipidemia: Secondary | ICD-10-CM | POA: Diagnosis not present

## 2023-02-13 DIAGNOSIS — N183 Chronic kidney disease, stage 3 unspecified: Secondary | ICD-10-CM | POA: Diagnosis not present

## 2023-02-13 DIAGNOSIS — Z125 Encounter for screening for malignant neoplasm of prostate: Secondary | ICD-10-CM | POA: Diagnosis not present

## 2023-02-13 DIAGNOSIS — Z79899 Other long term (current) drug therapy: Secondary | ICD-10-CM | POA: Diagnosis not present

## 2023-02-13 DIAGNOSIS — I10 Essential (primary) hypertension: Secondary | ICD-10-CM | POA: Diagnosis not present

## 2023-02-13 DIAGNOSIS — E1122 Type 2 diabetes mellitus with diabetic chronic kidney disease: Secondary | ICD-10-CM | POA: Diagnosis not present

## 2023-02-13 DIAGNOSIS — H524 Presbyopia: Secondary | ICD-10-CM | POA: Diagnosis not present

## 2023-02-19 DIAGNOSIS — N183 Chronic kidney disease, stage 3 unspecified: Secondary | ICD-10-CM | POA: Diagnosis not present

## 2023-02-19 DIAGNOSIS — I10 Essential (primary) hypertension: Secondary | ICD-10-CM | POA: Diagnosis not present

## 2023-02-19 DIAGNOSIS — E782 Mixed hyperlipidemia: Secondary | ICD-10-CM | POA: Diagnosis not present

## 2023-02-19 DIAGNOSIS — E1122 Type 2 diabetes mellitus with diabetic chronic kidney disease: Secondary | ICD-10-CM | POA: Diagnosis not present

## 2023-03-17 DIAGNOSIS — C61 Malignant neoplasm of prostate: Secondary | ICD-10-CM | POA: Diagnosis not present

## 2023-03-24 DIAGNOSIS — C61 Malignant neoplasm of prostate: Secondary | ICD-10-CM | POA: Diagnosis not present

## 2023-03-24 DIAGNOSIS — R351 Nocturia: Secondary | ICD-10-CM | POA: Diagnosis not present

## 2023-04-11 IMAGING — MR MR LUMBAR SPINE W/O CM
5 series · 31 of 48 positions shown · non-contrast
Comparison: None similar.

CLINICAL DATA: Low back pain for over 6 weeks.  Right sciatica

EXAM:
MRI LUMBAR SPINE WITHOUT CONTRAST
TECHNIQUE: Multiplanar, multisequence MR imaging of the lumbar spine was
performed. No intravenous contrast was administered.

[Series 5: T2 · sagittal · 4.0mm · 0.68mm/px · 6 of 17 slices shown (1 of 2)]
[im 1/17]
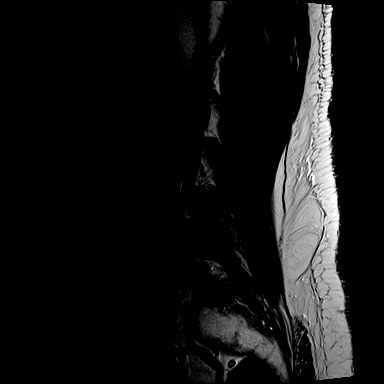
[im 4/17]
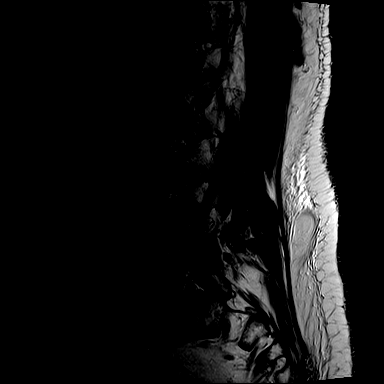
[im 7/17]
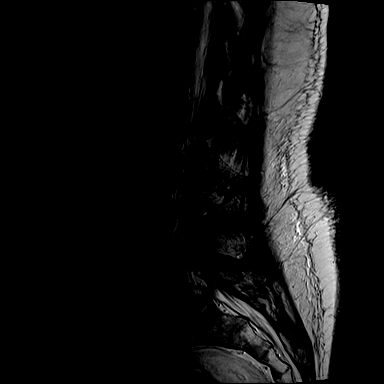
[im 10/17]
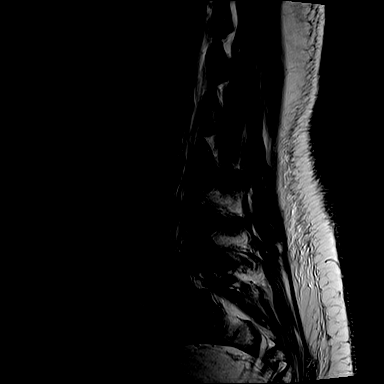
[im 13/17]
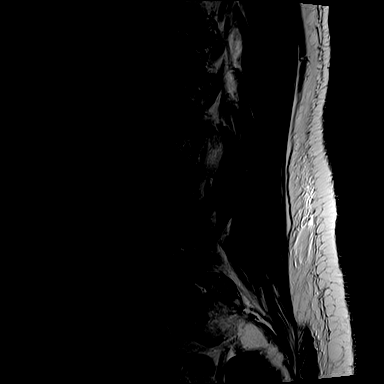
[im 17/17]
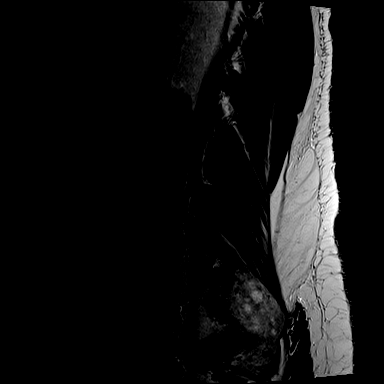

[Series 6: T1 · sagittal · 4.0mm · 0.81mm/px · 7 of 17 slices shown (1 of 2)]
[im 1/17]
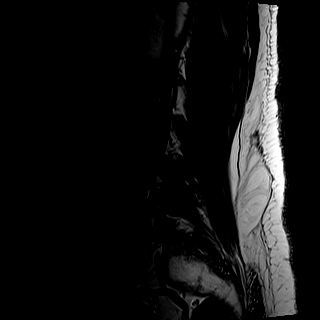
[im 3/17]
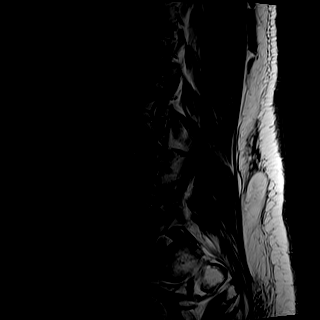
[im 6/17]
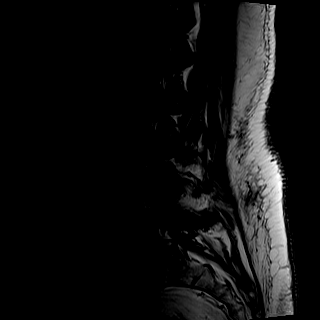
[im 9/17]
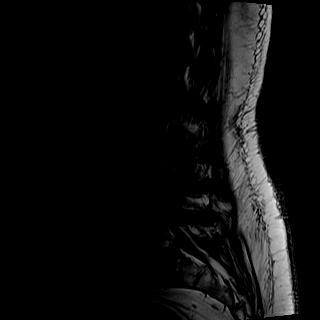
[im 11/17]
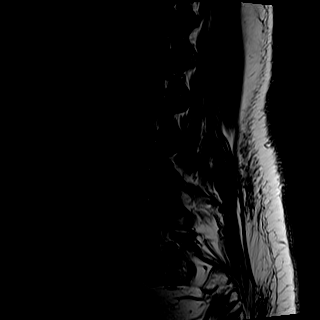
[im 14/17]
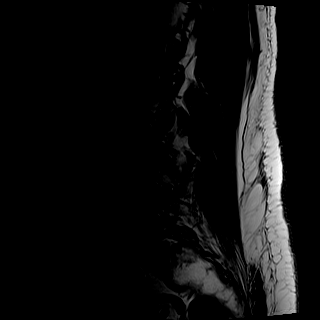
[im 17/17]
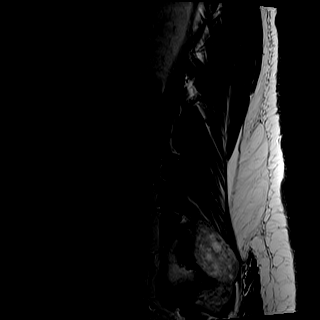

[Series 7: STIR · sagittal · 4.0mm · 0.51mm/px · 2 of 17 slices shown]
[im 1/17]
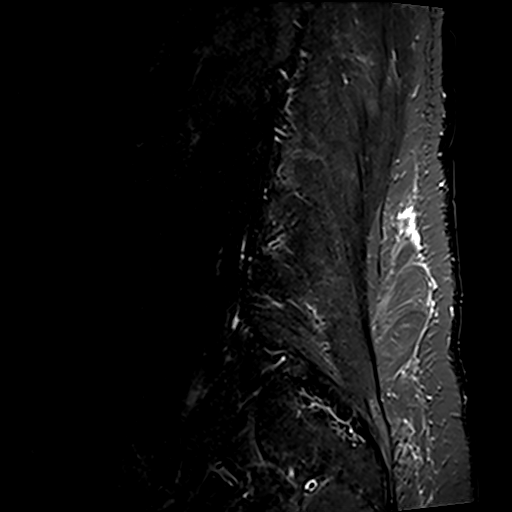
[im 3/17]
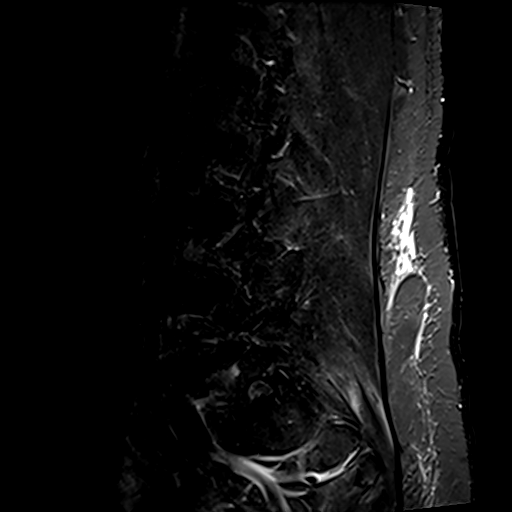

[Series 8: T2 · axial · 4.0mm · 0.70mm/px · z∈[-168,+37]mm · 8 of 35 slices shown (2 of 2)]
[im 1/35]
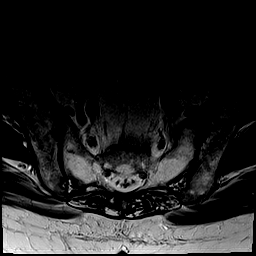
[im 6/35]
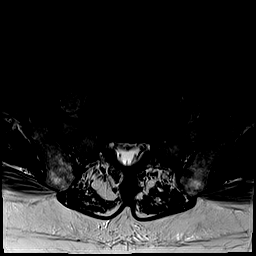
[im 11/35]
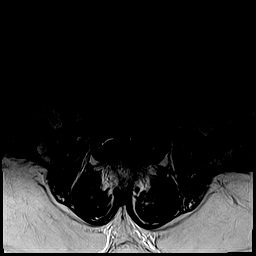
[im 16/35]
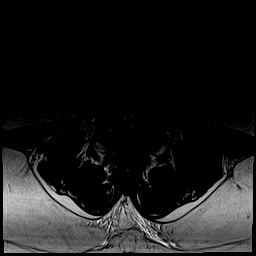
[im 19/35]
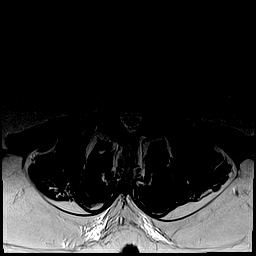
[im 24/35]
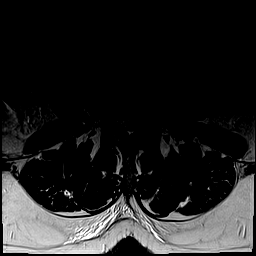
[im 29/35]
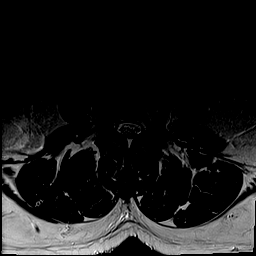
[im 35/35]
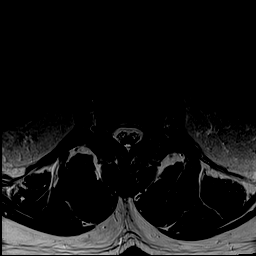

[Series 9: T1 · axial · 4.0mm · 0.35mm/px · z∈[-168,+37]mm · 8 of 35 slices shown (2 of 2)]
[im 1/35]
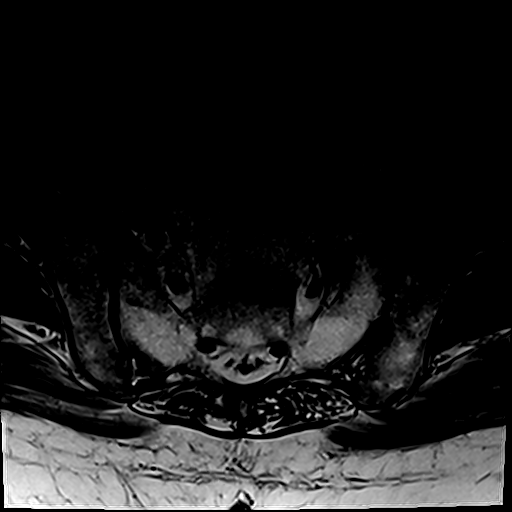
[im 6/35]
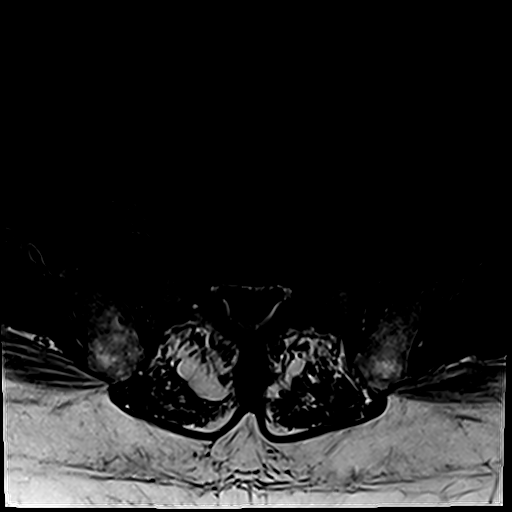
[im 11/35]
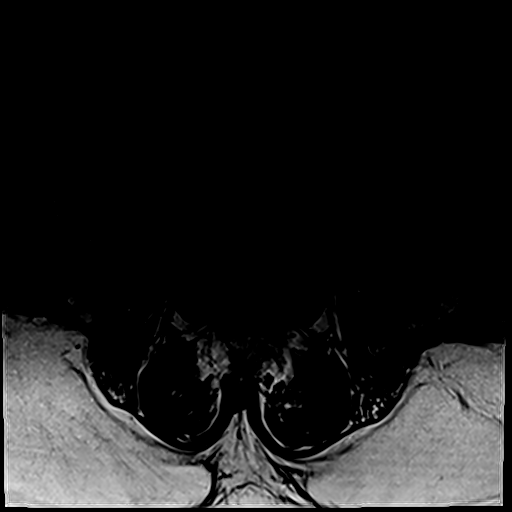
[im 16/35]
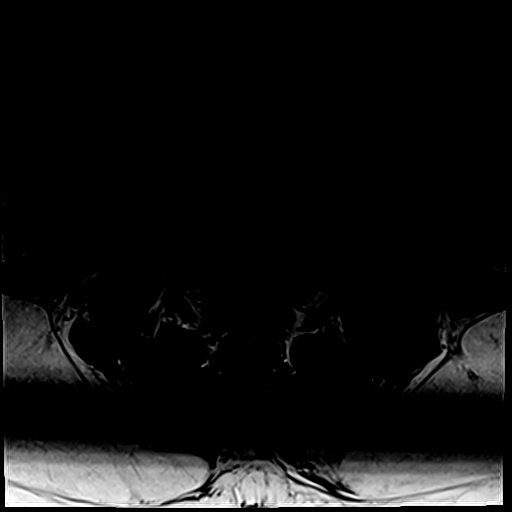
[im 19/35]
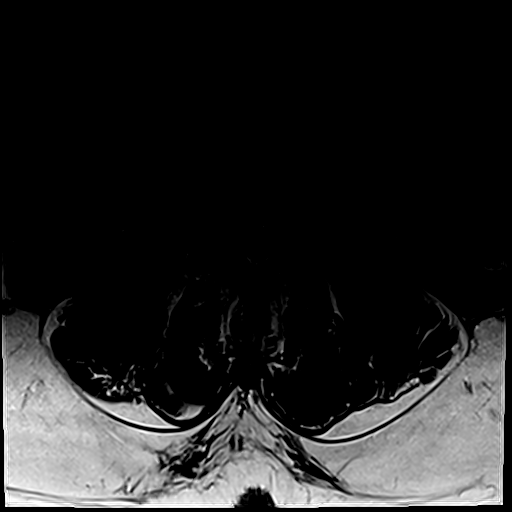
[im 24/35]
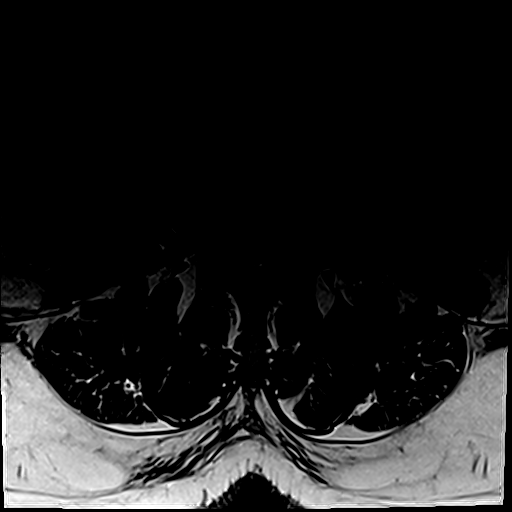
[im 29/35]
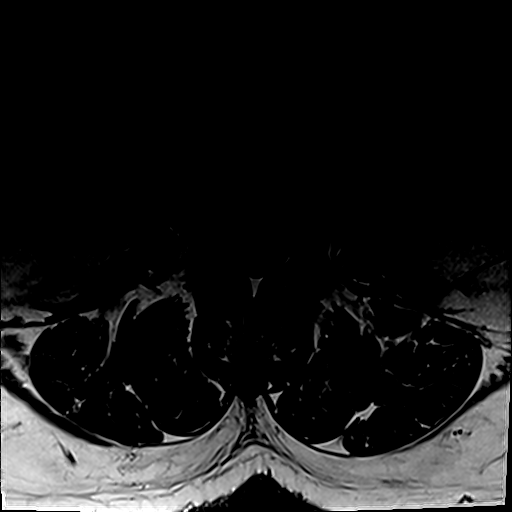
[im 35/35]
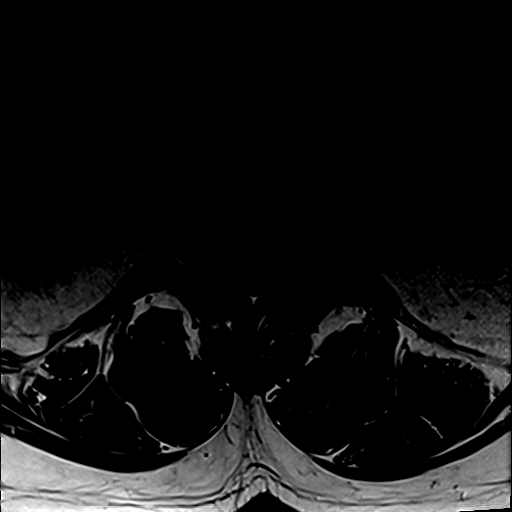

[31 of 48 positions shown; findings below may reference images not displayed]

FINDINGS: Segmentation: 5 lumbar type vertebrae based on the available
coverage

Alignment:  Physiologic.

Vertebrae:  No fracture, evidence of discitis, or bone lesion.

Conus medullaris and cauda equina: Conus extends to the L1-2 level.
Conus and cauda equina appear normal.

Paraspinal and other soft tissues: No evidence of perispinal mass or
inflammation

Disc levels:

T12- L1: Unremarkable.

L1-L2: Disc narrowing and endplate degeneration with circumferential
bulging greatest at the foramina. Mild facet spurring

L2-L3: Disc narrowing and bulging with bilateral inferior foraminal
protrusion. Mild facet spurring. Moderate narrowing of the thecal
sac

L3-L4: Disc narrowing and bulging. Facet osteoarthritis and
ligamentum flavum thickening. Compressive spinal stenosis.
Noncompressive bilateral foraminal narrowing based on sagittal
images

L4-L5: Disc narrowing and bulging with endplate ridging. Facet
osteoarthritis with spurring and marked ligamentous thickening.
Severe spinal stenosis. Noncompressive bilateral foraminal narrowing

L5-S1:Disc narrowing and bulging with ridging. Degenerative facet
spurring on both sides.
IMPRESSION: 1. Generalized lumbar spine degeneration with spinal stenosis that
is severe at L4-5, advanced at L3-4, and moderate at L2-3.
2. Diffusely patent foramina.

## 2023-05-11 DIAGNOSIS — I350 Nonrheumatic aortic (valve) stenosis: Secondary | ICD-10-CM | POA: Diagnosis not present

## 2023-05-11 DIAGNOSIS — I10 Essential (primary) hypertension: Secondary | ICD-10-CM | POA: Diagnosis not present

## 2023-05-11 DIAGNOSIS — R011 Cardiac murmur, unspecified: Secondary | ICD-10-CM | POA: Diagnosis not present

## 2023-05-15 DIAGNOSIS — I10 Essential (primary) hypertension: Secondary | ICD-10-CM | POA: Diagnosis not present

## 2023-05-15 DIAGNOSIS — N183 Chronic kidney disease, stage 3 unspecified: Secondary | ICD-10-CM | POA: Diagnosis not present

## 2023-05-15 DIAGNOSIS — E785 Hyperlipidemia, unspecified: Secondary | ICD-10-CM | POA: Diagnosis not present

## 2023-05-15 DIAGNOSIS — E1122 Type 2 diabetes mellitus with diabetic chronic kidney disease: Secondary | ICD-10-CM | POA: Diagnosis not present

## 2023-05-22 DIAGNOSIS — Z0000 Encounter for general adult medical examination without abnormal findings: Secondary | ICD-10-CM | POA: Diagnosis not present

## 2023-05-22 DIAGNOSIS — E119 Type 2 diabetes mellitus without complications: Secondary | ICD-10-CM | POA: Diagnosis not present

## 2023-05-22 DIAGNOSIS — Z23 Encounter for immunization: Secondary | ICD-10-CM | POA: Diagnosis not present

## 2023-05-22 DIAGNOSIS — E785 Hyperlipidemia, unspecified: Secondary | ICD-10-CM | POA: Diagnosis not present

## 2023-07-14 NOTE — Progress Notes (Unsigned)
   [facility]ENTER   MULTIDISCIPLINARY HEART VALVE TEAM  Structural Heart Office Note:  .    Date:  07/16/2023  ID:  Gerald Crosby, DOB Jul 01, 1941, MRN [id number] PCP: Oneal Grout, FNP  [facility] Providers Cardiologist: Dr. Daryel November, MD [place], [place])  History of Present Illness: .   Gerald Crosby is a 82 y.o. male with with a hx of HTN, HLD, Prostate CA, DM2, and critical aortic stenosis s/p TAVR with a 26 mm Edwards Sapien 3 THV via the TF approach on 07/15/22 and is here today for 1 year follow up.   Gerald Crosby presented to [facility] 07/07/22 after a syncopal episode the day of admission and was taken to [facility] where he underwent an echocardiogram which unfortunately showed preserved LV function at 60-65% with evidence of critical aortic stenosis with an AVA by VTI at 0.4cm2, mean gradient at 59.6mmHg, peak at 107.6mmHg, DI at 0.15, and SVI at 26. Given critical AS, patient was transferred to [facility] for further workup of his aortic stenosis. R/LHC showed no significant CAD.    He was then evaluated by the multidisciplinary valve team and felt to have critical, symptomatic aortic stenosis and to be a suitable candidate to proceed. He is now s/p successful TAVR with a 26 mm Edwards Sapien 3 THV via the TF approach on 07/15/22. He was started on ASA 81mg  QD.   He has done very well since his TAVR and is here today alone. He denies chest pain, SOB, palpitations, LE edema, orthopnea, PND, dizziness, or syncope. Denies bleeding in stool or urine. He continues to do yard work/gardening with no issues.    Physical Exam:   VS:  BP (!) 140/72   Pulse 68   Ht 5\' 9"  (1.753 m)   Wt 256 lb 12.8 oz (116.5 kg)   SpO2 96%   BMI 37.92 kg/m    Wt Readings from Last 3 Encounters:  07/15/23 256 lb 12.8 oz (116.5 kg)  07/30/22 248 lb 0.4 oz (112.5 kg)  07/16/22 244 lb 4.3 oz (110.8 kg)    General: Well developed, well nourished, NAD Lungs:Clear to ausculation bilaterally. No  wheezes, rales, or rhonchi. Breathing is unlabored. Cardiovascular: RRR with S1 S2. No murmurs Extremities: No edema.  Neuro: Alert and oriented. No focal deficits. No facial asymmetry. MAE spontaneously. Psych: Responds to questions appropriately with normal affect.    ASSESSMENT AND PLAN: .    Severe/critical AS: Patient doing great with NYHA class I symptoms s/p successful TAVR with a 26 mm Edwards Sapien 3 THV via the TF approach on 07/15/22. Echocardiogram today with normal LVEF at 60-65% and stable valve function with a mean gradient at with no PVL. Continue ASA monotherapy.  No dental SBE needed as he is edentulous with full dentures. Plan regular follow up with Dr. Hyacinth Meeker in [place], [place].    HTN: Stable with no changes needed at this time.    HLD: LDL this admission at 80. Continue pravastatin 40.    DM2: Continue current regimen. Follow with PCP.    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 Continue to follow with PCP.   Signed, Georgie Chard, NP

## 2023-07-15 ENCOUNTER — Ambulatory Visit: Payer: Medicare HMO

## 2023-07-15 ENCOUNTER — Ambulatory Visit: Payer: Medicare HMO | Attending: Cardiology

## 2023-07-15 VITALS — BP 140/72 | HR 68 | Ht 69.0 in | Wt 256.8 lb

## 2023-07-15 DIAGNOSIS — N1831 Chronic kidney disease, stage 3a: Secondary | ICD-10-CM | POA: Diagnosis not present

## 2023-07-15 DIAGNOSIS — I10 Essential (primary) hypertension: Secondary | ICD-10-CM | POA: Diagnosis not present

## 2023-07-15 DIAGNOSIS — I350 Nonrheumatic aortic (valve) stenosis: Secondary | ICD-10-CM | POA: Insufficient documentation

## 2023-07-15 DIAGNOSIS — Z952 Presence of prosthetic heart valve: Secondary | ICD-10-CM | POA: Insufficient documentation

## 2023-07-15 LAB — ECHOCARDIOGRAM COMPLETE
AR max vel: 2.2 cm2
AV Area VTI: 2.41 cm2
AV Area mean vel: 2.14 cm2
AV Mean grad: 15 mm[Hg]
AV Peak grad: 28.6 mm[Hg]
Ao pk vel: 2.68 m/s
Area-P 1/2: 3.42 cm2
S' Lateral: 2.7 cm

## 2023-07-15 NOTE — Patient Instructions (Signed)
Medication Instructions:   *If you need a refill on your cardiac medications before your next appointment, please call your pharmacy*   Lab Work:  If you have labs (blood work) drawn today and your tests are completely normal, you will receive your results only by: MyChart Message (if you have MyChart) OR A paper copy in the mail If you have any lab test that is abnormal or we need to change your treatment, we will call you to review the results.   Testing/Procedures:    Follow-Up: At [facility], you and your health needs are our priority.  As part of our continuing mission to provide you with exceptional heart care, we have created designated Provider Care Teams.  These Care Teams include your primary Cardiologist (physician) and Advanced Practice Providers (APPs -  Physician Assistants and Nurse Practitioners) who all work together to provide you with the care you need, when you need it.  We recommend signing up for the patient portal called "MyChart".  Sign up information is provided on this After Visit Summary.  MyChart is used to connect with patients for Virtual Visits (Telemedicine).  Patients are able to view lab/test results, encounter notes, upcoming appointments, etc.  Non-urgent messages can be sent to your provider as well.   To learn more about what you can do with MyChart, go to [email or web address].    

## 2023-08-14 DIAGNOSIS — E1122 Type 2 diabetes mellitus with diabetic chronic kidney disease: Secondary | ICD-10-CM | POA: Diagnosis not present

## 2023-08-14 DIAGNOSIS — N183 Chronic kidney disease, stage 3 unspecified: Secondary | ICD-10-CM | POA: Diagnosis not present

## 2023-08-14 DIAGNOSIS — I10 Essential (primary) hypertension: Secondary | ICD-10-CM | POA: Diagnosis not present

## 2023-08-21 DIAGNOSIS — E785 Hyperlipidemia, unspecified: Secondary | ICD-10-CM | POA: Diagnosis not present

## 2023-08-21 DIAGNOSIS — I129 Hypertensive chronic kidney disease with stage 1 through stage 4 chronic kidney disease, or unspecified chronic kidney disease: Secondary | ICD-10-CM | POA: Diagnosis not present

## 2023-08-21 DIAGNOSIS — Z23 Encounter for immunization: Secondary | ICD-10-CM | POA: Diagnosis not present

## 2023-08-21 DIAGNOSIS — N183 Chronic kidney disease, stage 3 unspecified: Secondary | ICD-10-CM | POA: Diagnosis not present

## 2023-08-21 DIAGNOSIS — E1122 Type 2 diabetes mellitus with diabetic chronic kidney disease: Secondary | ICD-10-CM | POA: Diagnosis not present

## 2023-09-17 DIAGNOSIS — C61 Malignant neoplasm of prostate: Secondary | ICD-10-CM | POA: Diagnosis not present

## 2023-11-11 DIAGNOSIS — E1122 Type 2 diabetes mellitus with diabetic chronic kidney disease: Secondary | ICD-10-CM | POA: Diagnosis not present

## 2023-11-11 DIAGNOSIS — I10 Essential (primary) hypertension: Secondary | ICD-10-CM | POA: Diagnosis not present

## 2023-11-11 DIAGNOSIS — N183 Chronic kidney disease, stage 3 unspecified: Secondary | ICD-10-CM | POA: Diagnosis not present

## 2023-11-11 DIAGNOSIS — E785 Hyperlipidemia, unspecified: Secondary | ICD-10-CM | POA: Diagnosis not present

## 2023-11-16 DIAGNOSIS — E785 Hyperlipidemia, unspecified: Secondary | ICD-10-CM | POA: Diagnosis not present

## 2023-11-16 DIAGNOSIS — I350 Nonrheumatic aortic (valve) stenosis: Secondary | ICD-10-CM | POA: Diagnosis not present

## 2023-11-16 DIAGNOSIS — I10 Essential (primary) hypertension: Secondary | ICD-10-CM | POA: Diagnosis not present

## 2023-11-16 DIAGNOSIS — I517 Cardiomegaly: Secondary | ICD-10-CM | POA: Diagnosis not present

## 2023-11-18 DIAGNOSIS — N183 Chronic kidney disease, stage 3 unspecified: Secondary | ICD-10-CM | POA: Diagnosis not present

## 2023-11-18 DIAGNOSIS — I10 Essential (primary) hypertension: Secondary | ICD-10-CM | POA: Diagnosis not present

## 2023-11-18 DIAGNOSIS — E785 Hyperlipidemia, unspecified: Secondary | ICD-10-CM | POA: Diagnosis not present

## 2023-11-18 DIAGNOSIS — E1122 Type 2 diabetes mellitus with diabetic chronic kidney disease: Secondary | ICD-10-CM | POA: Diagnosis not present

## 2023-12-29 DIAGNOSIS — Z113 Encounter for screening for infections with a predominantly sexual mode of transmission: Secondary | ICD-10-CM | POA: Diagnosis not present

## 2024-02-11 DIAGNOSIS — E559 Vitamin D deficiency, unspecified: Secondary | ICD-10-CM | POA: Diagnosis not present

## 2024-02-11 DIAGNOSIS — E119 Type 2 diabetes mellitus without complications: Secondary | ICD-10-CM | POA: Diagnosis not present

## 2024-03-16 DIAGNOSIS — C61 Malignant neoplasm of prostate: Secondary | ICD-10-CM | POA: Diagnosis not present

## 2024-03-23 DIAGNOSIS — R351 Nocturia: Secondary | ICD-10-CM | POA: Diagnosis not present

## 2024-03-23 DIAGNOSIS — C61 Malignant neoplasm of prostate: Secondary | ICD-10-CM | POA: Diagnosis not present

## 2024-05-16 DIAGNOSIS — E1122 Type 2 diabetes mellitus with diabetic chronic kidney disease: Secondary | ICD-10-CM | POA: Diagnosis not present

## 2024-05-16 DIAGNOSIS — N183 Chronic kidney disease, stage 3 unspecified: Secondary | ICD-10-CM | POA: Diagnosis not present

## 2024-05-16 DIAGNOSIS — E785 Hyperlipidemia, unspecified: Secondary | ICD-10-CM | POA: Diagnosis not present

## 2024-05-16 DIAGNOSIS — I10 Essential (primary) hypertension: Secondary | ICD-10-CM | POA: Diagnosis not present

## 2024-06-03 DIAGNOSIS — L309 Dermatitis, unspecified: Secondary | ICD-10-CM | POA: Diagnosis not present

## 2024-06-03 DIAGNOSIS — G5603 Carpal tunnel syndrome, bilateral upper limbs: Secondary | ICD-10-CM | POA: Diagnosis not present

## 2024-06-03 DIAGNOSIS — G5601 Carpal tunnel syndrome, right upper limb: Secondary | ICD-10-CM | POA: Diagnosis not present

## 2024-06-03 DIAGNOSIS — G5602 Carpal tunnel syndrome, left upper limb: Secondary | ICD-10-CM | POA: Diagnosis not present

## 2024-08-22 DIAGNOSIS — I10 Essential (primary) hypertension: Secondary | ICD-10-CM | POA: Diagnosis not present

## 2024-08-22 DIAGNOSIS — E1122 Type 2 diabetes mellitus with diabetic chronic kidney disease: Secondary | ICD-10-CM | POA: Diagnosis not present

## 2024-08-22 DIAGNOSIS — N183 Chronic kidney disease, stage 3 unspecified: Secondary | ICD-10-CM | POA: Diagnosis not present

## 2024-10-21 ENCOUNTER — Ambulatory Visit: Admitting: Internal Medicine
